# Patient Record
Sex: Female | Born: 1990 | Hispanic: Yes | Marital: Married | Smoking: Never smoker
Health system: Southern US, Community
[De-identification: ages and names within clinical notes are randomized; demographics above are authoritative.]

## PROBLEM LIST (undated history)

## (undated) DIAGNOSIS — N83209 Unspecified ovarian cyst, unspecified side: Secondary | ICD-10-CM

## (undated) HISTORY — PX: NO PAST SURGERIES: SHX2092

---

## 2012-04-17 ENCOUNTER — Ambulatory Visit: Payer: Self-pay | Admitting: Otolaryngology

## 2012-04-17 LAB — HCG, QUANTITATIVE, PREGNANCY: Beta Hcg, Quant.: <1 m[IU]/mL — ABNORMAL LOW

## 2013-11-28 ENCOUNTER — Encounter: Payer: Self-pay | Admitting: Obstetrics and Gynecology

## 2014-01-27 IMAGING — CT CT NECK WITH CONTRAST
2 series · 10 of 14 positions shown, 12 images · IV contrast (agent unspecified)
Comparison: none

REASON FOR EXAM: swelling knot under chin  eval thyroglosssal duct cyst
COMMENTS:

PROCEDURE:     CT  - CT NECK WITH CONTRAST  - April 17, 2012  [DATE]
RESULT:
TECHNIQUE: Neck CT is performed with 70 ml of Tsovue-6V7 iodinated
intravenous contrast with images reconstructed at 3.0 mm slice thickness in
the axial plane.
There is no previous exam for comparison.

[Series 2: soft tissue · axial · 0.57mm/px · z∈[-302,-68]mm · 8 of 102 slices shown, 10 images]
[im 12/102  soft-tissue]
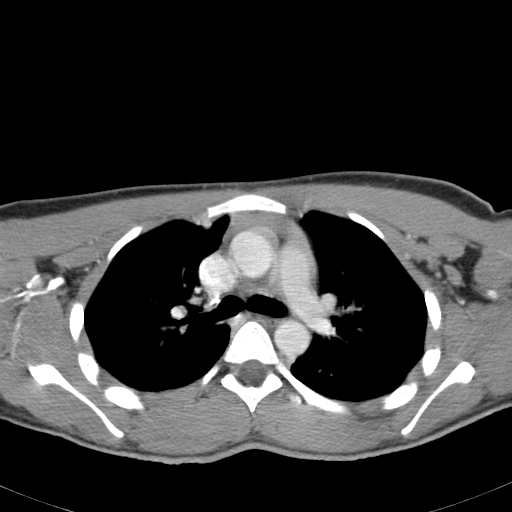
[im 12/102  bone]
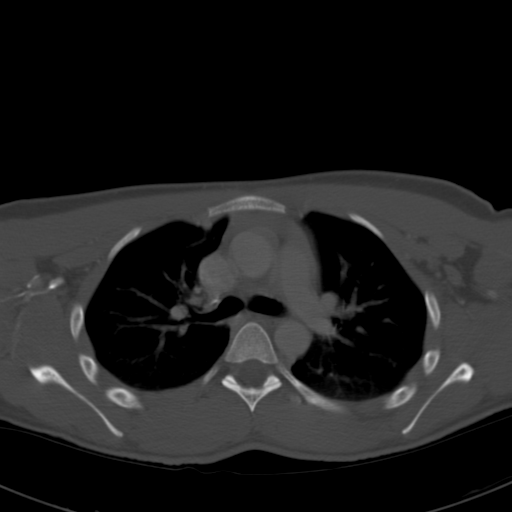
[im 23/102  bone]
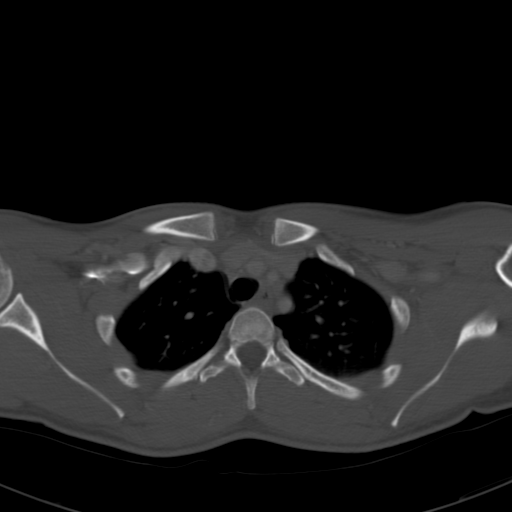
[im 34/102  bone]
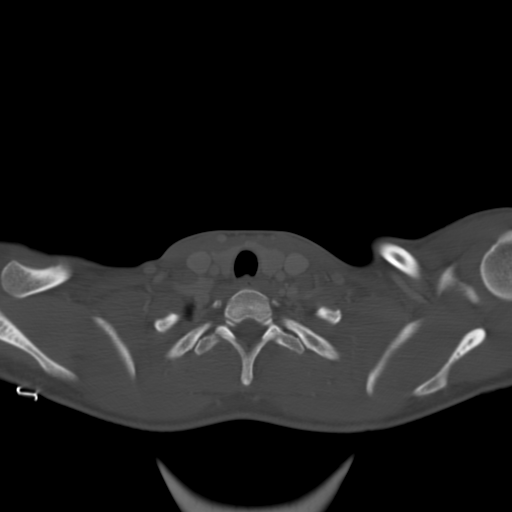
[im 45/102  bone]
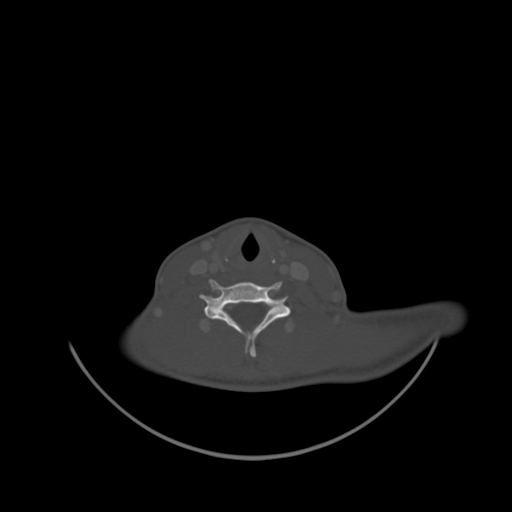
[im 57/102  soft-tissue]
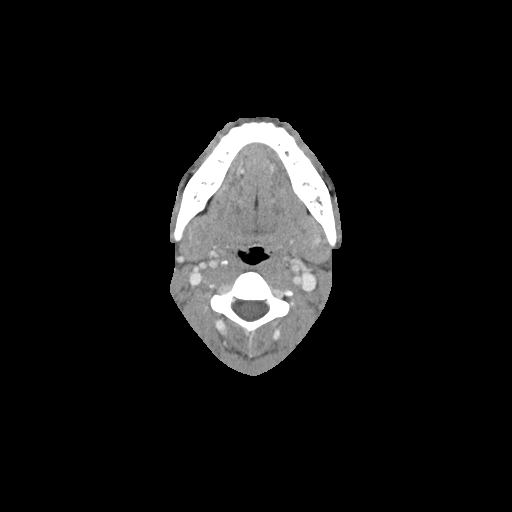
[im 57/102  bone]
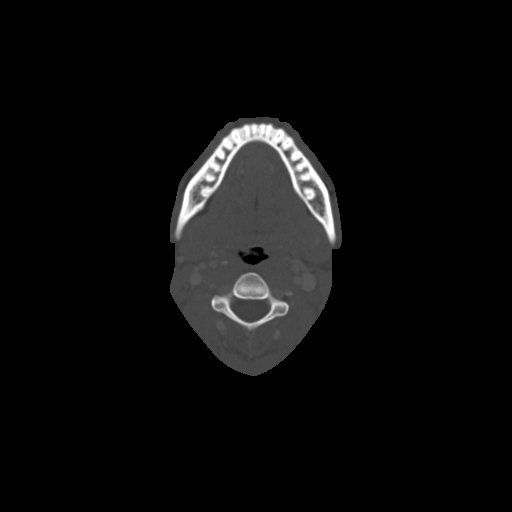
[im 68/102  bone]
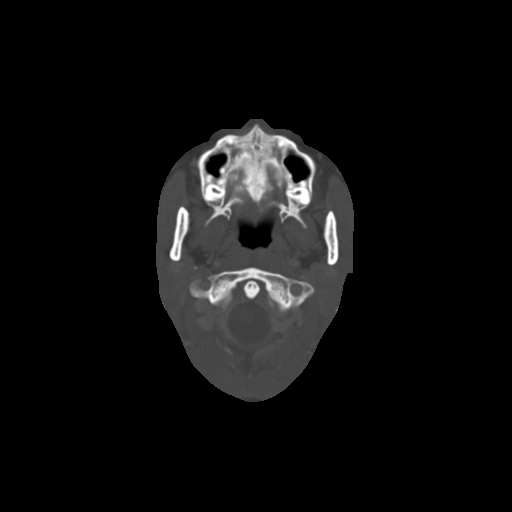
[im 79/102  bone]
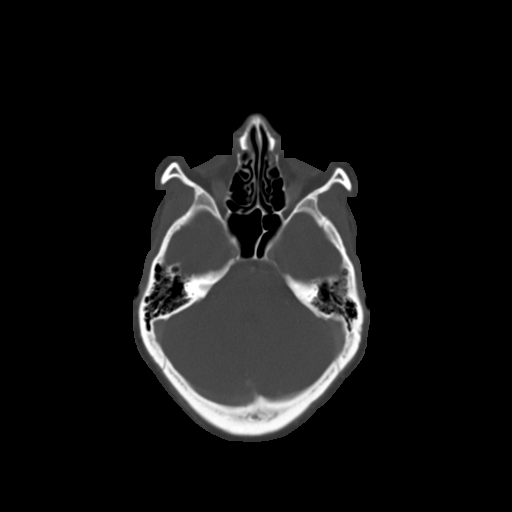
[im 90/102  bone]
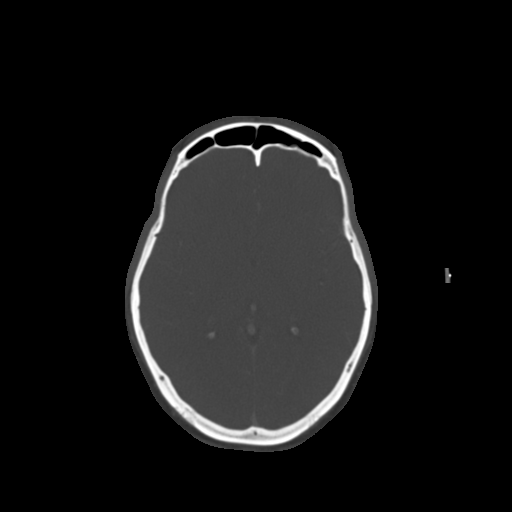

[Series 4: lung windows · axial · 0.66mm/px · z∈[-302,-266]mm · 2 of 36 slices shown]
[im 12/36  bone]
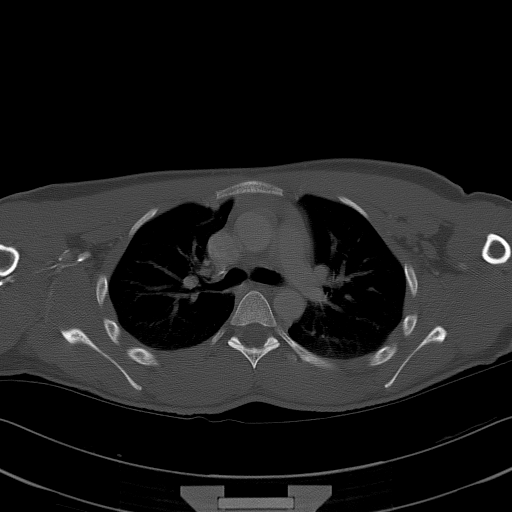
[im 24/36  bone]
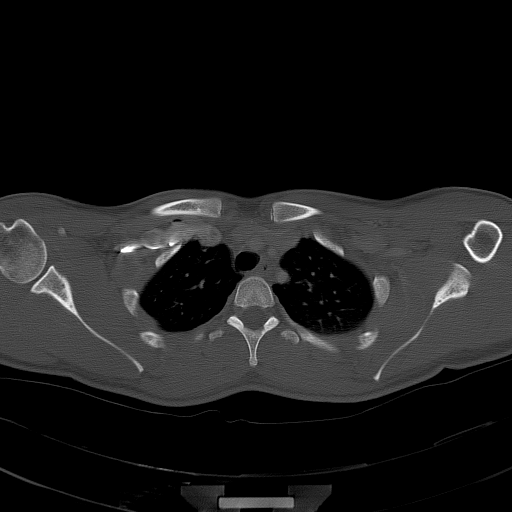

[10 of 14 positions shown; findings below may reference images not displayed]

FINDINGS: There is a submental lymph node present on the axial
reconstructions on image 54 measuring 12.5 x 7.6 mm. The submandibular and
parotid glands appear to be unremarkable. The sinuses show normal aeration.
The mastoid air cells are unremarkable. The orbits appear normal. The base
of the brain and included mid brain region appear to be unremarkable. The
temporal lobes show no focal abnormality. The nasopharynx, oropharynx,
hypopharynx and laryngeal region appear unremarkable. The thyroid lobes show
no discrete mass, enlargement or abnormal enhancement. The supraclavicular,
superior mediastinal and axillary regions are unremarkable. There is a
calcified granuloma posteriorly in the left lung on image 99 in the superior
segment of the left lower lobe consistent with previous granulomatous
disease. The lungs otherwise appear clear.
IMPRESSION: The area of concern appears to correlate with a lymph node.
No thyroglossal duct cyst is appreciated. No mucosal based lesion is evident.

[REDACTED]

## 2014-06-11 ENCOUNTER — Inpatient Hospital Stay: Payer: Self-pay | Admitting: Obstetrics and Gynecology

## 2014-06-11 ENCOUNTER — Observation Stay: Payer: Self-pay | Admitting: Obstetrics and Gynecology

## 2014-08-05 NOTE — H&P (Signed)
L&D Evaluation:  History:  HPI 24 yo G2P1001 at 40.7 presenting in labor. She had been seen previously today and ruled out for labor. Now presenting 5 cm painfully contracting.   Presents with contractions   [name] Medical History other  3.8 cm simple cyst seen during pregnancy.   [name] Surgical History none   Medications Pre Natal Vitamins   Allergies NKDA   Social History none   Family History Non-Contributory   ROS:  ROS All systems were reviewed.  HEENT, CNS, GI, GU, Respiratory, CV, Renal and Musculoskeletal systems were found to be normal.   Exam:  Vital Signs stable   General no apparent distress   Mental Status clear   Chest clear   Heart normal sinus rhythm   Abdomen gravid, non-tender   Estimated Fetal Weight Average for gestational age   Fetal Position Cephalic   Back no CVAT   Mebranes Intact   FHT normal rate with no decels   Ucx regular   Impression:  Impression active labor   Plan:  Comments 1. Expectant management 2. FWB: Cat 1 strip 3. Anticipate vaginal delivery   Electronic Signatures: Areta HaberGoodman, Krisann Mckenna M (MD)  (Signed 17-Mar-16 03:20)  Authored: L&D Evaluation   Last Updated: 17-Mar-16 03:20 by Areta HaberGoodman, Lorimer Tiberio M (MD)

## 2015-09-09 ENCOUNTER — Other Ambulatory Visit: Payer: Self-pay | Admitting: Otolaryngology

## 2015-09-09 DIAGNOSIS — R221 Localized swelling, mass and lump, neck: Secondary | ICD-10-CM

## 2015-09-16 ENCOUNTER — Ambulatory Visit: Payer: BLUE CROSS/BLUE SHIELD

## 2017-07-27 DIAGNOSIS — N949 Unspecified condition associated with female genital organs and menstrual cycle: Secondary | ICD-10-CM | POA: Insufficient documentation

## 2017-08-25 ENCOUNTER — Encounter: Payer: Self-pay | Admitting: *Deleted

## 2017-08-25 ENCOUNTER — Encounter
Admission: RE | Admit: 2017-08-25 | Discharge: 2017-08-25 | Disposition: A | Discharge status: Home / Self Care | Payer: BLUE CROSS/BLUE SHIELD | Source: Ambulatory Visit | Attending: Obstetrics and Gynecology | Admitting: Obstetrics and Gynecology

## 2017-08-25 ENCOUNTER — Other Ambulatory Visit: Payer: Self-pay

## 2017-08-25 DIAGNOSIS — Z0183 Encounter for blood typing: Secondary | ICD-10-CM | POA: Insufficient documentation

## 2017-08-25 DIAGNOSIS — Z01812 Encounter for preprocedural laboratory examination: Secondary | ICD-10-CM | POA: Insufficient documentation

## 2017-08-25 HISTORY — DX: Unspecified ovarian cyst, unspecified side: N83.209

## 2017-08-25 LAB — TYPE AND SCREEN
ABO/RH(D): A POS
ANTIBODY SCREEN: NEGATIVE

## 2017-08-25 LAB — BASIC METABOLIC PANEL
ANION GAP: 6 (ref 5–15)
BUN: 10 mg/dL (ref 6–20)
CALCIUM: 8.7 mg/dL — AB (ref 8.9–10.3)
CO2: 26 mmol/L (ref 22–32)
CREATININE: 0.63 mg/dL (ref 0.44–1.00)
Chloride: 108 mmol/L (ref 101–111)
Glucose, Bld: 86 mg/dL (ref 65–99)
Potassium: 3.9 mmol/L (ref 3.5–5.1)
SODIUM: 140 mmol/L (ref 135–145)

## 2017-08-25 LAB — CBC
HCT: 40 % (ref 35.0–47.0)
HEMOGLOBIN: 13.6 g/dL (ref 12.0–16.0)
MCH: 31.5 pg (ref 26.0–34.0)
MCHC: 33.9 g/dL (ref 32.0–36.0)
MCV: 93 fL (ref 80.0–100.0)
Platelets: 166 10*3/uL (ref 150–440)
RBC: 4.31 MIL/uL (ref 3.80–5.20)
RDW: 13.1 % (ref 11.5–14.5)
WBC: 5.3 10*3/uL (ref 3.6–11.0)

## 2017-08-25 NOTE — H&P (Signed)
Chief Complaint:    Patient ID: Jill Montgomery is a 27 y.o. female presenting with Pre Op Consulting (sign consents)  on 08/25/2017  HPI:  1. Left ovarian cyst, followed in 2017 when it was 3 cm, now 4.6cm complex left ovarian cyst with small septation, appears blood filled, small borders.   2. Dyspareunia: - present for several months - pain in band across abdomen - worse on left  3. 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 Dyschezia - present for several months as well - described as "stomach upset" - no particular food makes better or worse - no weight loss  6. Trying to conceive x 8 months - worried that the ovarian cysts are keeping her from getting pregnant   Pelvic pain now in band, sounds like endometriosis. Make sure to bx all quadrants.  Past Medical History:  has a past medical history of Anemia, unspecified, Neck mass, and Pap smear, as part of routine gynecological examination (04/11/12).  Past Surgical History:  has no past surgical history on file. Family History: family history includes No Known Problems in her father and mother. Social History:  reports that she has never smoked. She has never used smokeless tobacco. She reports that she does not drink alcohol or use drugs. OB/GYN History:  OB History    Gravida  2   Para  1   Term  1   Preterm      AB      Living  1     SAB      TAB      Ectopic      Molar      Multiple      Live Births             Allergies: has No Known Allergies. Medications:  Current Outpatient Medications:  .  Compound Medication, BLT cream, Disp: 1 each, Rfl: 0 .  meloxicam (MOBIC) 7.5 MG tablet, Take 1 tablet (7.5 mg total) by mouth once daily, Disp: 30 tablet, Rfl: 1   Review of Systems: No SOB, no palpitations  or chest pain, no new lower extremity edema, no nausea or vomiting or bowel or bladder complaints. See HPI for gyn specific ROS.   Exam:     BP 120/80   Pulse 72   Ht 160 cm (5\' 3" )   Wt 59.4 kg (131 lb)   BMI 23.21 kg/m   General: Patient is well-groomed, well-nourished, appears stated age in no acute distress  HEENT: head is atraumatic and normocephalic, trachea is midline, neck is supple with no palpable nodules  CV: Regular rhythm and normal heart rate, no murmur  Pulm: Clear to auscultation throughout lung fields with no wheezing, crackles, or rhonchi. No increased work of breathing  Abdomen: soft , no mass, non-tender, no rebound tenderness, no hepatomegaly  Pelvic:  deferred   Impression:   The encounter diagnosis was Preop examination.    Plan:    The patient has been diagnosed with a left adnexal mass measuring 4 cm, chronic pelvic pain and is trying to conceive. I would like to try ovarian suppression but she declines for now.  I have reviewed treatment options with the patient, including expectant management, hormonal treatment with combination or progesterone-only  methods or surgical treatment with a  laproscopic cystectomy with chromotubation and possible peritoneal biopsies.  Risks and benefits of surgery have been reviewed with the patient, including but not limited to infection, bleeding, injury to the uterus and surrounding organs, such as bowel, bladder, blood vessels, nerves and ureters, risks of anesthesia, blood clots and death.  The details of the procedure were discussed in detail.  The patient has been made aware that if the procedure cannot be accomplished safely with a minimally invasive technique, I will convert to an open procedure.   Consents have been signed and all questions have been answered to her satisfaction  Return for Postop check.  Jill KicksBETHANY EVANGELINE Kourtnie Sachs, MD

## 2017-08-25 NOTE — Patient Instructions (Addendum)
Your procedure is scheduled on: Friday, September 01, 2017 Report to Day Surgery on the 2nd floor of the [facility] To find out your arrival time, please call [phone] between 1PM - 3PM on: Thursday, August 31, 2017  REMEMBER: Instructions that are not followed completely may result in serious medical risk, up to and including death; or upon the discretion of your surgeon and anesthesiologist your surgery may need to be rescheduled.  Do not eat food after midnight the night before your procedure.  No gum chewing, lozengers or hard candies.  You may however, drink CLEAR liquids up to 2 hours before you are scheduled to arrive for your surgery. Do not drink anything within 2 hours of the start of your surgery.  Clear liquids include: - water  - apple juice without pulp - clear gatorade - black coffee or tea (Do NOT add anything to the coffee or tea) Do NOT drink anything that is not on this list.  No Alcohol for 24 hours before or after surgery.  No Smoking including e-cigarettes for 24 hours prior to surgery.  No chewable tobacco products for at least 6 hours prior to surgery.  No nicotine patches on the day of surgery.  On the morning of surgery brush your teeth with toothpaste and water, you may rinse your mouth with mouthwash if you wish. Do not swallow any toothpaste or mouthwash.  Notify your doctor if there is any change in your medical condition (cold, fever, infection).  Do not wear jewelry, make-up, hairpins, clips or nail polish.  Do not wear lotions, powders, or perfumes. You may wear deodorant.  Do not shave 48 hours prior to surgery.   Contacts and dentures may not be worn into surgery.  Do not bring valuables to the hospital, including drivers license, insurance or credit cards.   is not responsible for any belongings or valuables.   TAKE THESE MEDICATIONS THE MORNING OF SURGERY:  none  Use CHG Soap as directed on instruction sheet.  NOW!  Stop  Anti-inflammatories (NSAIDS) such as MELOXICAM, Advil, Aleve, Ibuprofen, Motrin, Naproxen, Naprosyn and Aspirin based products such as Excedrin, Goodys Powder, BC Powder. (May take Tylenol or Acetaminophen if needed.)  NOW!  Stop ANY OVER THE COUNTER supplements until after surgery.  Wear comfortable clothing (specific to your surgery type) to the hospital.  Plan for stool softeners for home use.  If you are being discharged the day of surgery, you will not be allowed to drive home. You will need a responsible adult to drive you home and stay with you that night.   If you are taking public transportation, you will need to have a responsible adult with you. Please confirm with your physician that it is acceptable to use public transportation.   Please call [phone] if you have any questions about these instructions.     Su procedimiento est programado para: Friday, September 01, 2017  Presntese a: Day Surgery  Para saber su hora de llegada por favor llame al [phone] entre la 1PM - 3PM el da:Thursday, August 31, 2017   Recuerde: [employer] no se siguen Programmer, multimedia en un riesgo de salud grave, incluyendo hasta la [place] o a discrecin de su cirujano y Scientific laboratory technician, su ciruga se puede posponer.    No coma nada despus de la medianoche de la noche anterior a su  procedimiento. No coma chicles ni caramelos duros. Puede tomar lquidos claros hasta 2 horas antes de su hora  programada de llegada al hospital para su procedimiento. No tome lquidos claros durante el transcurso de las 2 horas de su llegada programada al hospital para su procedimiento, ya que esto puede llevar a que su procedimiento se retrase o tenga que volver a Magazine features editorprogramarse.  Los lquidos claros incluyen:         - Agua o jugo de Boulevardmanzana sin pulpa         - Bebidas claras con carbohidratos como ClearFast o Gatorade         - Caf negro o t claro (sin leche, sin cremas, no agregue nada al  caf ni al  t)  No tome nada que no est en esta lista.  Los pacientes con diabetes tipo 1 y tipo 2 solo deben Printmakertomar agua.  Llame a la clnica de PreCare o a la unidad de Same Day Surgery si tiene alguna pregunta sobre estas instrucciones.  No fume ni use cigarrillos electrnicos durante las 24 horas previas a su [place].  No use ningn producto de tabaco masticable durante al menos 6 horas antes de la [place].    No tome alcohol durante las 24 horas antes ni despus de la [place].    Informe a su mdico si hay algn cambio en su condicin mdica (resfriado, fiebre, infecciones).   No use joyas, maquillajes, pinzas/ganchos para el cabello ni esmalte de uas.  No use lociones, polvos o perfumes.  Puede usar desodorante.    No se afeite 48 horas antes de la [place].  Los hombres pueden Commercial Metals Companyafeitarse la cara y el cuello.  No lleve objetos de valor al hospital.  [facility] no se hace responsable de ningn tipo de pertenencias u objetos de Licensed conveyancervalor.    Los lentes de Oreanacontacto, las dentaduras postizas o puentes no se pueden usar en la [place].  Deje su maleta en el auto.  Despus de la ciruga podr traerla a su habitacin.  Para los pacientes que sean ingresados al hospital, el tiempo en el cual se ledar de alta es determinado por su equipo de Rincontratamiento.   A los pacientes que se les da de alta el mismo da de la ciruga no se les permitir conducir a Higher education careers advisercasa.    Utilice el jabn de CHG segn lo indicado Deje de tomar antiinflamatorios el da:  MELOXICAM Deje de tomar suplementos hasta despus de la Leisure centre managerciruga

## 2017-09-01 ENCOUNTER — Encounter: Payer: Self-pay | Admitting: Anesthesiology

## 2017-09-01 ENCOUNTER — Ambulatory Visit: Payer: BLUE CROSS/BLUE SHIELD | Admitting: Anesthesiology

## 2017-09-01 ENCOUNTER — Encounter
Admission: RE | Disposition: A | Discharge status: Home / Self Care | Payer: Self-pay | Source: Ambulatory Visit | Attending: Obstetrics and Gynecology

## 2017-09-01 ENCOUNTER — Ambulatory Visit
Admission: RE | Admit: 2017-09-01 | Discharge: 2017-09-01 | Disposition: A | Discharge status: Home / Self Care | Payer: BLUE CROSS/BLUE SHIELD | Source: Ambulatory Visit | Attending: Obstetrics and Gynecology | Admitting: Obstetrics and Gynecology

## 2017-09-01 DIAGNOSIS — Z791 Long term (current) use of non-steroidal anti-inflammatories (NSAID): Secondary | ICD-10-CM | POA: Diagnosis not present

## 2017-09-01 DIAGNOSIS — K5900 Constipation, unspecified: Secondary | ICD-10-CM | POA: Diagnosis not present

## 2017-09-01 DIAGNOSIS — N94819 Vulvodynia, unspecified: Secondary | ICD-10-CM | POA: Insufficient documentation

## 2017-09-01 DIAGNOSIS — N83202 Unspecified ovarian cyst, left side: Secondary | ICD-10-CM | POA: Insufficient documentation

## 2017-09-01 DIAGNOSIS — N8302 Follicular cyst of left ovary: Secondary | ICD-10-CM | POA: Diagnosis not present

## 2017-09-01 DIAGNOSIS — R1032 Left lower quadrant pain: Secondary | ICD-10-CM | POA: Diagnosis not present

## 2017-09-01 DIAGNOSIS — R102 Pelvic and perineal pain: Secondary | ICD-10-CM | POA: Insufficient documentation

## 2017-09-01 DIAGNOSIS — N941 Unspecified dyspareunia: Secondary | ICD-10-CM | POA: Diagnosis not present

## 2017-09-01 HISTORY — PX: LAPAROSCOPIC OVARIAN CYSTECTOMY: SHX6248

## 2017-09-01 HISTORY — PX: CHROMOPERTUBATION: SHX6288

## 2017-09-01 LAB — POCT PREGNANCY, URINE: PREG TEST UR: NEGATIVE

## 2017-09-01 LAB — ABO/RH: ABO/RH(D): A POS

## 2017-09-01 SURGERY — EXCISION, CYST, OVARY, LAPAROSCOPIC
Anesthesia: General

## 2017-09-01 MED ORDER — LACTATED RINGERS IV SOLN
INTRAVENOUS | Status: DC
  Administered 2017-09-01 (×2): via INTRAVENOUS

## 2017-09-01 MED ORDER — ROCURONIUM BROMIDE 50 MG/5ML IV SOLN
INTRAVENOUS | Status: AC
  Filled 2017-09-01: qty 1

## 2017-09-01 MED ORDER — CEFAZOLIN SODIUM-DEXTROSE 1-4 GM/50ML-% IV SOLN
INTRAVENOUS | Status: DC | PRN
  Administered 2017-09-01: 1 g via INTRAVENOUS

## 2017-09-01 MED ORDER — FENTANYL CITRATE (PF) 100 MCG/2ML IJ SOLN
INTRAMUSCULAR | Status: AC
  Filled 2017-09-01: qty 2

## 2017-09-01 MED ORDER — DEXAMETHASONE SODIUM PHOSPHATE 10 MG/ML IJ SOLN
INTRAMUSCULAR | Status: DC | PRN
  Administered 2017-09-01: 10 mg via INTRAVENOUS

## 2017-09-01 MED ORDER — GABAPENTIN 300 MG PO CAPS
900.0000 mg | ORAL_CAPSULE | ORAL | Status: AC
  Administered 2017-09-01: 900 mg via ORAL

## 2017-09-01 MED ORDER — DOXYCYCLINE HYCLATE 100 MG PO TABS
100.0000 mg | ORAL_TABLET | ORAL | Status: AC
  Administered 2017-09-01: 100 mg via ORAL
  Filled 2017-09-01: qty 1

## 2017-09-01 MED ORDER — CELECOXIB 200 MG PO CAPS
ORAL_CAPSULE | ORAL | Status: AC
  Administered 2017-09-01: 400 mg via ORAL
  Filled 2017-09-01: qty 2

## 2017-09-01 MED ORDER — ACETAMINOPHEN 500 MG PO TABS
1000.0000 mg | ORAL_TABLET | ORAL | Status: AC
  Administered 2017-09-01: 1000 mg via ORAL

## 2017-09-01 MED ORDER — METHYLENE BLUE 0.5 % INJ SOLN
INTRAVENOUS | Status: AC
  Filled 2017-09-01: qty 10

## 2017-09-01 MED ORDER — OXYCODONE HCL 5 MG PO CAPS: 5.0000 mg | ORAL_CAPSULE | Freq: Four times a day (QID) | ORAL | 0 refills | Status: DC | PRN

## 2017-09-01 MED ORDER — DOCUSATE SODIUM 100 MG PO CAPS: 100.0000 mg | ORAL_CAPSULE | Freq: Two times a day (BID) | ORAL | 0 refills | Status: DC

## 2017-09-01 MED ORDER — CEFAZOLIN SODIUM 1 G IJ SOLR
INTRAMUSCULAR | Status: AC
  Filled 2017-09-01: qty 10

## 2017-09-01 MED ORDER — BUPIVACAINE HCL 0.5 % IJ SOLN
INTRAMUSCULAR | Status: DC | PRN
  Administered 2017-09-01: 20 mL

## 2017-09-01 MED ORDER — DEXAMETHASONE SODIUM PHOSPHATE 10 MG/ML IJ SOLN
INTRAMUSCULAR | Status: AC
  Filled 2017-09-01: qty 1

## 2017-09-01 MED ORDER — FENTANYL CITRATE (PF) 100 MCG/2ML IJ SOLN
INTRAMUSCULAR | Status: AC
  Administered 2017-09-01: 25 ug via INTRAVENOUS
  Filled 2017-09-01: qty 2

## 2017-09-01 MED ORDER — MELOXICAM 7.5 MG PO TABS: 7.5000 mg | ORAL_TABLET | Freq: Every day | ORAL | 1 refills | Status: DC | PRN

## 2017-09-01 MED ORDER — IBUPROFEN 800 MG PO TABS: 800.0000 mg | ORAL_TABLET | Freq: Three times a day (TID) | ORAL | 1 refills | Status: DC | PRN

## 2017-09-01 MED ORDER — ONDANSETRON HCL 4 MG/2ML IJ SOLN
INTRAMUSCULAR | Status: AC
  Filled 2017-09-01: qty 2

## 2017-09-01 MED ORDER — OXYCODONE HCL 5 MG PO TABS
ORAL_TABLET | ORAL | Status: AC
  Filled 2017-09-01: qty 1

## 2017-09-01 MED ORDER — FAMOTIDINE 20 MG PO TABS
20.0000 mg | ORAL_TABLET | Freq: Once | ORAL | Status: AC
  Administered 2017-09-01: 20 mg via ORAL

## 2017-09-01 MED ORDER — OXYCODONE HCL 5 MG PO TABS
5.0000 mg | ORAL_TABLET | Freq: Four times a day (QID) | ORAL | Status: DC | PRN
  Administered 2017-09-01: 5 mg via ORAL

## 2017-09-01 MED ORDER — MIDAZOLAM HCL 2 MG/2ML IJ SOLN
INTRAMUSCULAR | Status: AC
  Filled 2017-09-01: qty 2

## 2017-09-01 MED ORDER — PROPOFOL 10 MG/ML IV BOLUS
INTRAVENOUS | Status: DC | PRN
  Administered 2017-09-01: 150 mg via INTRAVENOUS

## 2017-09-01 MED ORDER — ACETAMINOPHEN 10 MG/ML IV SOLN
INTRAVENOUS | Status: DC | PRN
  Administered 2017-09-01: 1000 mg via INTRAVENOUS

## 2017-09-01 MED ORDER — ACETAMINOPHEN 500 MG PO TABS
ORAL_TABLET | ORAL | Status: AC
  Administered 2017-09-01: 1000 mg via ORAL
  Filled 2017-09-01: qty 2

## 2017-09-01 MED ORDER — PROPOFOL 10 MG/ML IV BOLUS
INTRAVENOUS | Status: AC
  Filled 2017-09-01: qty 20

## 2017-09-01 MED ORDER — GABAPENTIN 300 MG PO CAPS
ORAL_CAPSULE | ORAL | Status: AC
  Administered 2017-09-01: 900 mg via ORAL
  Filled 2017-09-01: qty 3

## 2017-09-01 MED ORDER — ONDANSETRON HCL 4 MG/2ML IJ SOLN
INTRAMUSCULAR | Status: DC | PRN
  Administered 2017-09-01: 4 mg via INTRAVENOUS

## 2017-09-01 MED ORDER — FENTANYL CITRATE (PF) 100 MCG/2ML IJ SOLN
INTRAMUSCULAR | Status: DC | PRN
  Administered 2017-09-01: 100 ug via INTRAVENOUS
  Administered 2017-09-01 (×2): 50 ug via INTRAVENOUS

## 2017-09-01 MED ORDER — GABAPENTIN 800 MG PO TABS: 800.0000 mg | ORAL_TABLET | Freq: Every day | ORAL | 0 refills | Status: DC

## 2017-09-01 MED ORDER — FAMOTIDINE 20 MG PO TABS
ORAL_TABLET | ORAL | Status: AC
  Administered 2017-09-01: 20 mg via ORAL
  Filled 2017-09-01: qty 1

## 2017-09-01 MED ORDER — ONDANSETRON HCL 4 MG/2ML IJ SOLN: 4.0000 mg | Freq: Once | INTRAMUSCULAR | Status: DC | PRN

## 2017-09-01 MED ORDER — MIDAZOLAM HCL 2 MG/2ML IJ SOLN
INTRAMUSCULAR | Status: DC | PRN
  Administered 2017-09-01: 2 mg via INTRAVENOUS

## 2017-09-01 MED ORDER — SUGAMMADEX SODIUM 200 MG/2ML IV SOLN
INTRAVENOUS | Status: DC | PRN
  Administered 2017-09-01: 200 mg via INTRAVENOUS

## 2017-09-01 MED ORDER — ACETAMINOPHEN 500 MG PO TABS: 1000.0000 mg | ORAL_TABLET | Freq: Four times a day (QID) | ORAL | 0 refills | Status: AC

## 2017-09-01 MED ORDER — ROCURONIUM BROMIDE 100 MG/10ML IV SOLN
INTRAVENOUS | Status: DC | PRN
  Administered 2017-09-01: 10 mg via INTRAVENOUS
  Administered 2017-09-01: 40 mg via INTRAVENOUS

## 2017-09-01 MED ORDER — BUPIVACAINE HCL (PF) 0.5 % IJ SOLN
INTRAMUSCULAR | Status: AC
  Filled 2017-09-01: qty 30

## 2017-09-01 MED ORDER — ACETAMINOPHEN NICU IV SYRINGE 10 MG/ML
INTRAVENOUS | Status: AC
  Filled 2017-09-01: qty 1

## 2017-09-01 MED ORDER — CELECOXIB 200 MG PO CAPS
400.0000 mg | ORAL_CAPSULE | ORAL | Status: AC
  Administered 2017-09-01: 400 mg via ORAL

## 2017-09-01 MED ORDER — FENTANYL CITRATE (PF) 100 MCG/2ML IJ SOLN
25.0000 ug | INTRAMUSCULAR | Status: DC | PRN
  Administered 2017-09-01 (×4): 25 ug via INTRAVENOUS

## 2017-09-01 SURGICAL SUPPLY — 37 items
APPLICATOR ARISTA FLEXITIP XL (MISCELLANEOUS) ×4 IMPLANT
BAG URINE DRAINAGE (UROLOGICAL SUPPLIES) IMPLANT
BLADE SURG SZ11 CARB STEEL (BLADE) ×4 IMPLANT
CATH FOLEY 2WAY  5CC 16FR (CATHETERS) ×2
CATH URTH 16FR FL 2W BLN LF (CATHETERS) ×2 IMPLANT
CHLORAPREP W/TINT 26ML (MISCELLANEOUS) ×4 IMPLANT
CLOSURE WOUND 1/4X4 (GAUZE/BANDAGES/DRESSINGS)
DERMABOND ADVANCED (GAUZE/BANDAGES/DRESSINGS) ×2
DERMABOND ADVANCED .7 DNX12 (GAUZE/BANDAGES/DRESSINGS) ×2 IMPLANT
GLOVE BIO SURGEON STRL SZ7 (GLOVE) ×8 IMPLANT
GLOVE INDICATOR 7.5 STRL GRN (GLOVE) ×4 IMPLANT
GOWN STRL REUS W/ TWL LRG LVL3 (GOWN DISPOSABLE) ×4 IMPLANT
GOWN STRL REUS W/TWL LRG LVL3 (GOWN DISPOSABLE) ×4
HEMOSTAT ARISTA ABSORB 1G (MISCELLANEOUS) ×4 IMPLANT
IRRIGATION STRYKERFLOW (MISCELLANEOUS) ×2 IMPLANT
IRRIGATOR STRYKERFLOW (MISCELLANEOUS) ×4
IV NS 1000ML (IV SOLUTION) ×2
IV NS 1000ML BAXH (IV SOLUTION) ×2 IMPLANT
KIT PINK PAD W/HEAD ARE REST (MISCELLANEOUS) ×4
KIT PINK PAD W/HEAD ARM REST (MISCELLANEOUS) ×2 IMPLANT
KIT TURNOVER CYSTO (KITS) ×4 IMPLANT
LABEL OR SOLS (LABEL) ×4 IMPLANT
NS IRRIG 500ML POUR BTL (IV SOLUTION) ×4 IMPLANT
PACK GYN LAPAROSCOPIC (MISCELLANEOUS) ×4 IMPLANT
PAD OB MATERNITY 4.3X12.25 (PERSONAL CARE ITEMS) ×4 IMPLANT
PAD PREP 24X41 OB/GYN DISP (PERSONAL CARE ITEMS) ×4 IMPLANT
POUCH SPECIMEN RETRIEVAL 10MM (ENDOMECHANICALS) IMPLANT
SCISSORS METZENBAUM CVD 33 (INSTRUMENTS) ×4 IMPLANT
SET CYSTO W/LG BORE CLAMP LF (SET/KITS/TRAYS/PACK) IMPLANT
SLEEVE ENDOPATH XCEL 5M (ENDOMECHANICALS) ×4 IMPLANT
STRIP CLOSURE SKIN 1/4X4 (GAUZE/BANDAGES/DRESSINGS) IMPLANT
SUT MNCRL AB 4-0 PS2 18 (SUTURE) ×4 IMPLANT
SUT VIC AB 2-0 UR6 27 (SUTURE) ×4 IMPLANT
SUT VIC AB 4-0 SH 27 (SUTURE) ×2
SUT VIC AB 4-0 SH 27XANBCTRL (SUTURE) ×2 IMPLANT
TROCAR XCEL NON-BLD 5MMX100MML (ENDOMECHANICALS) ×4 IMPLANT
TUBING INSUFFLATION (TUBING) ×4 IMPLANT

## 2017-09-01 NOTE — Anesthesia Preprocedure Evaluation (Addendum)
Anesthesia Evaluation  Patient identified by MRN, date of birth, ID band Patient awake    Reviewed: Allergy & Precautions, NPO status , [name] Chart, lab work & pertinent test results, reviewed documented beta blocker date and time   Airway Mallampati: II  TM Distance: >3 FB     Dental  (+) Chipped, Partial Upper   Pulmonary           Cardiovascular      Neuro/Psych    GI/Hepatic   Endo/Other    Renal/GU      Musculoskeletal   Abdominal   Peds  Hematology   Anesthesia Other Findings   Reproductive/Obstetrics                            Anesthesia Physical Anesthesia Plan  ASA: II  Anesthesia Plan: General   Post-op Pain Management:    Induction: Intravenous  PONV Risk Score and Plan:   Airway Management Planned: Oral ETT  Additional Equipment:   Intra-op Plan:   Post-operative Plan:   Informed Consent: I have reviewed the patients History and Physical, chart, labs and discussed the procedure including the risks, benefits and alternatives for the proposed anesthesia with the patient or authorized representative who has indicated his/her understanding and acceptance.     Plan Discussed with: CRNA  Anesthesia Plan Comments:         Anesthesia Quick Evaluation

## 2017-09-01 NOTE — Transfer of Care (Signed)
Immediate Anesthesia Transfer of Care Note  Patient: Jill Montgomery  Procedure(s) Performed: LAPAROSCOPIC OVARIAN CYSTECTOMY (Left ) CHROMOPERTUBATION (N/A )  Patient Location: PACU  Anesthesia Type:General  Level of Consciousness: sedated  Airway & Oxygen Therapy: Patient connected to face mask oxygen  Post-op Assessment: Post -op Vital signs reviewed and stable  Post vital signs: stable  Last Vitals:  Vitals Value Taken Time  BP    Temp 36.6 C 09/01/2017  7:32 PM  Pulse 63 09/01/2017  7:32 PM  Resp 9 09/01/2017  7:32 PM  SpO2 100 % 09/01/2017  7:32 PM    Last Pain:  Vitals:   09/01/17 1932  TempSrc: Temporal  PainSc:          Complications: No apparent anesthesia complications

## 2017-09-01 NOTE — Anesthesia Procedure Notes (Signed)
Procedure Name: Intubation Date/Time: 09/01/2017 5:39 PM Performed by: Jonna Clark, CRNA Pre-anesthesia Checklist: Patient identified, Patient being monitored, Timeout performed, Emergency Drugs available and Suction available Patient Re-evaluated:Patient Re-evaluated prior to induction Oxygen Delivery Method: Circle system utilized Preoxygenation: Pre-oxygenation with 100% oxygen Induction Type: IV induction Ventilation: Mask ventilation without difficulty Laryngoscope Size: Mac and 3 Grade View: Grade I Tube type: Oral Tube size: 7.0 mm Number of attempts: 1 Airway Equipment and Method: Stylet Placement Confirmation: ETT inserted through vocal cords under direct vision,  positive ETCO2 and breath sounds checked- equal and bilateral Secured at: 21 cm Tube secured with: Tape Dental Injury: Teeth and Oropharynx as per pre-operative assessment

## 2017-09-01 NOTE — Op Note (Signed)
Jill SavageAda Montgomery PROCEDURE DATE: 09/01/2017  PREOPERATIVE DIAGNOSIS: Chronic pelvic pain, infertility, left ovarian cyst POSTOPERATIVE DIAGNOSIS: Same PROCEDURE: Operative laparoscopy, peritoneal biopsies, left ovarian cystectomy, chromotubation  SURGEON:  Dr. Christeen DouglasBethany Carey Johndrow ASSISTANT: CST ANESTHESIOLOGIST: Lenard SimmerKarenz, [name], MD Anesthesiologist: Lenard SimmerKarenz, [name], MD CRNA: Irving BurtonBachich, [name], CRNA; [name], [name], CRNA  INDICATIONS: 27 y.o.with history of chronic pelvic pain, left ovarian cyst and infertility desiring surgical evaluation.   Please see preoperative notes for further details.  Risks of surgery were discussed with the patient including but not limited to: bleeding which may require transfusion or reoperation; infection which may require antibiotics; injury to bowel, bladder, ureters or other surrounding organs; need for additional procedures including laparotomy; thromboembolic phenomenon, incisional problems and other postoperative/anesthesia complications. Written informed consent was obtained.    FINDINGS:  Boggy uterus, enlarged ovaries and normal fallopian tubes bilaterally.  Peritoneal biopsies were taken and sent to pathology. No other abdominal/pelvic abnormality.  Normal upper abdomen.  Left tube with good spill and right tube blocked without spill noted.  ANESTHESIA:    General INTRAVENOUS FLUIDS: 1100 ml ESTIMATED BLOOD LOSS: 20 ml URINE OUTPUT: 50 ml SPECIMENS: Peritoneal biopsies, left ovarian cyst COMPLICATIONS: None immediate  PROCEDURE IN DETAIL:  The patient had sequential compression devices applied to her lower extremities while in the preoperative area.  She was then taken to the operating room where general anesthesia was administered and was found to be adequate.  She was placed in the dorsal lithotomy position, and was prepped and draped in a sterile manner.  1g Ancef given iv because it was confirmed after anesthesia administered that her po Doxycycline had been  given 7 hrs early I preop. A red rubber catheter was inserted into her bladder and a uterine manipulator was then advanced into the uterus with an acorn cervical cup and a 60 cc syringe attached.  After an adequate timeout was performed, attention was turned to the abdomen where an umbilical incision was made with the scalpel.  The Optiview 5-mm trocar and sleeve were then advanced without difficulty with the laparoscope under direct visualization into the abdomen.  The abdomen was then insufflated with carbon dioxide gas and adequate pneumoperitoneum was obtained.   A detailed survey of the patient's pelvis and abdomen revealed the findings as mentioned above.   Biopsy forceps were used to take peritoneal biopsies.    Ovarian cystectomy completed with dissection forceps and electrocautery. Arista used on the cyst wall bed for hemostasis.   120cc of blue NSS was infused into the uterus through the acorn. Spill noted on the left but not the right.  The operative site was surveyed, and it was found to be hemostatic.  No intraoperative injury to surrounding organs was noted.  Pictures were taken of the quadrants and pelvis. The abdomen was desufflated and all instruments were then removed from the patient's abdomen. The uterine manipulator was removed without complications.  All incisions were closed with 4-0 Vicryl and Dermabond.   The patient tolerated the procedures well.  All instruments, needles, and sponge counts were correct x 2. The patient was taken to the recovery room in stable condition.

## 2017-09-01 NOTE — Anesthesia Post-op Follow-up Note (Signed)
Anesthesia QCDR form completed.        

## 2017-09-01 NOTE — Discharge Instructions (Signed)
Laparoscopic Ovarian Surgery Discharge Instructions  For the next three days, take ibuprofen and acetaminophen on a schedule, every 8 hours. You can take them together or you can intersperse them, and take one every four hours. I also gave you gabapentin for nighttime, to help you sleep and also to control pain. Take gabapentin medicines at night for at least the next 3 nights. You also have a narcotic, oxycodone, to take as needed if the above medicines don't help.  Postop constipation is a major cause of pain. Stay well hydrated, walk as you tolerate, and take over the counter senna as well as stool softeners if you need them.   RISKS AND COMPLICATIONS   Infection.  Bleeding.  Injury to surrounding organs.  Anesthetic side effects.   PROCEDURE   You may be given a medicine to help you relax (sedative) before the procedure. You will be given a medicine to make you sleep (general anesthetic) during the procedure.  A tube will be put down your throat to help your breath while under general anesthesia.  Several small cuts (incisions) are made in the lower abdominal area and one incision is made near the belly button.  Your abdominal area will be inflated with a safe gas (carbon dioxide). This helps give the surgeon room to operate, visualize, and helps the surgeon avoid other organs.  A thin, lighted tube (laparoscope) with a camera attached is inserted into your abdomen through the incision near the belly button. Other small instruments may also be inserted through other abdominal incisions.  The ovary is located and are removed.  After the ovary is removed, the gas is released from the abdomen.  The incisions will be closed with stitches (sutures), and Dermabond. A bandage may be placed over the incisions.  AFTER THE PROCEDURE   You will also have some mild abdominal discomfort for 3-7 days. You will be given pain medicine to ease any discomfort.  As long as there are no  problems, you may be allowed to go home. Someone will need to drive you home and be with you for at least 24 hours once home.  You may have some mild discomfort in the throat. This is from the tube placed in your throat while you were sleeping.  You may experience discomfort in the shoulder area from some trapped air between the liver and diaphragm. This sensation is normal and will slowly go away on its own.  HOME CARE INSTRUCTIONS   Take all medicines as directed.  Only take over-the-counter or prescription medicines for pain, discomfort, or fever as directed by your caregiver.  Resume daily activities as directed.  Showers are preferred over baths for 2 weeks.  You may resume sexual activities in 1 week or as you feel you would like to.  Do not drive while taking narcotics.  SEEK MEDICAL CARE IF: .  There is increasing abdominal pain.  You feel lightheaded or faint.  You have the chills.  You have an oral temperature above 102 F (38.9 C).  There is pus-like (purulent) drainage from any of the wounds.  You are unable to pass gas or have a bowel movement.  You feel sick to your stomach (nauseous) or throw up (vomit) and can't control it with your medicines.  MAKE SURE YOU:   Understand these instructions.  Will watch your condition.  Will get help right away if you are not doing well or get worse.  ExitCare Patient Information 2013 [employer].     AMBULATORY SURGERY  DISCHARGE INSTRUCTIONS   1) The drugs that you were given will stay in your system until tomorrow so for the next 24 hours you should not:  A) Drive an automobile B) Make any legal decisions C) Drink any alcoholic beverage   2) You may resume regular meals tomorrow.  Today it is better to start with liquids and gradually work up to solid foods.  You may eat anything you prefer, but it is better to start with liquids, then soup and crackers, and gradually work up to solid  foods.   3) Please notify your doctor immediately if you have any unusual bleeding, trouble breathing, redness and pain at the surgery site, drainage, fever, or pain not relieved by medication.    4) Additional Instructions:        Please contact your physician with any problems or Same Day Surgery at [phone], Monday through Friday 6 am to 4 pm, or [place] [facility] [place] Main number at [phone].   

## 2017-09-04 ENCOUNTER — Encounter: Payer: Self-pay | Admitting: Obstetrics and Gynecology

## 2017-09-04 NOTE — Anesthesia Postprocedure Evaluation (Signed)
Anesthesia Post Note  Patient: Jill Montgomery  Procedure(s) Performed: LAPAROSCOPIC OVARIAN CYSTECTOMY (Left ) CHROMOPERTUBATION (N/A )  Patient location during evaluation: PACU Anesthesia Type: General Level of consciousness: awake and alert Pain management: pain level controlled Vital Signs Assessment: post-procedure vital signs reviewed and stable Respiratory status: spontaneous breathing, nonlabored ventilation, respiratory function stable and patient connected to nasal cannula oxygen Cardiovascular status: blood pressure returned to baseline and stable Postop Assessment: no apparent nausea or vomiting Anesthetic complications: no     Last Vitals:  Vitals:   09/01/17 2102 09/01/17 2155  BP: 137/79 127/76  Pulse: 97 77  Resp: 14 18  Temp:    SpO2: 100% 100%    Last Pain:  Vitals:   09/01/17 2155  TempSrc:   PainSc: 3                  Lenard SimmerAndrew Caro Brundidge

## 2017-09-07 LAB — SURGICAL PATHOLOGY

## 2018-02-06 DIAGNOSIS — Z3490 Encounter for supervision of normal pregnancy, unspecified, unspecified trimester: Secondary | ICD-10-CM

## 2018-02-06 HISTORY — DX: Encounter for supervision of normal pregnancy, unspecified, unspecified trimester: Z34.90

## 2018-03-05 ENCOUNTER — Ambulatory Visit (INDEPENDENT_AMBULATORY_CARE_PROVIDER_SITE_OTHER): Payer: BLUE CROSS/BLUE SHIELD | Admitting: Advanced Practice Midwife

## 2018-03-05 ENCOUNTER — Other Ambulatory Visit (HOSPITAL_COMMUNITY)
Admission: RE | Admit: 2018-03-05 | Discharge: 2018-03-05 | Disposition: A | Discharge status: Home / Self Care | Payer: BLUE CROSS/BLUE SHIELD | Source: Ambulatory Visit | Attending: Advanced Practice Midwife | Admitting: Advanced Practice Midwife

## 2018-03-05 ENCOUNTER — Encounter: Payer: Self-pay | Admitting: Advanced Practice Midwife

## 2018-03-05 VITALS — BP 118/74 | Wt 139.0 lb

## 2018-03-05 DIAGNOSIS — Z113 Encounter for screening for infections with a predominantly sexual mode of transmission: Secondary | ICD-10-CM

## 2018-03-05 DIAGNOSIS — Z3A10 10 weeks gestation of pregnancy: Secondary | ICD-10-CM

## 2018-03-05 DIAGNOSIS — Z3481 Encounter for supervision of other normal pregnancy, first trimester: Secondary | ICD-10-CM

## 2018-03-05 DIAGNOSIS — Z1379 Encounter for other screening for genetic and chromosomal anomalies: Secondary | ICD-10-CM

## 2018-03-05 DIAGNOSIS — Z124 Encounter for screening for malignant neoplasm of cervix: Secondary | ICD-10-CM | POA: Diagnosis present

## 2018-03-05 DIAGNOSIS — Z3480 Encounter for supervision of other normal pregnancy, unspecified trimester: Secondary | ICD-10-CM | POA: Insufficient documentation

## 2018-03-05 DIAGNOSIS — O200 Threatened abortion: Secondary | ICD-10-CM | POA: Insufficient documentation

## 2018-03-05 DIAGNOSIS — R102 Pelvic and perineal pain: Secondary | ICD-10-CM | POA: Insufficient documentation

## 2018-03-05 NOTE — Patient Instructions (Signed)
Plan de alimentacin para mujeres embarazadas (Eating Plan for Pregnant Women) Durante el [place], el cuerpo Pension scheme manager recibir alimentacin extra para brindar sustento al beb que est creciendo. Se recomienda que consuma lo siguiente:  150caloras extra diariamente Physicist, medical trimestre.  300caloras extra diariamente durante el segundo trimestre.  300caloras extra diariamente durante el tercer trimestre. Comer una dieta saludable y bien equilibrada es muy importante para su salud y la del beb. Adems, usted tiene ms necesidad de recibir algunas vitaminas y [place], como cido flico, calcio, hierro y vitaminaD. QU DEBO SABER ACERCA DE LA ALIMENTACIN DURANTE EL EMBARAZO?  No trate de Geophysical data processor o hacer una dieta durante el embarazo.  Elija alimentos nutritivos y saludables. Opte por la mitad de un sndwich con un vaso de Primary school teacher de una barra de [place] o una bebida endulzada con azcar que contenga muchas caloras.  Limite el consumo general de alimentos con "caloras vacas". Entre los alimentos con poco valor nutritivo se Office Depot, los postres, los [place], las bebidas endulzadas con International aid/development worker y los alimentos fritos.  Consuma una amplia variedad de alimentos, especialmente frutas y verduras.  Tome una vitamina prenatal para ayudar a Patent examiner las necesidades adicionales durante el [place], especficamente de cido flico, hierro, calcio y vitaminaD.  Haga actividad fsica. Pdale al mdico que le recomiende ejercicios que sean especficos para usted.  Cuide la salubridad y la higiene en relacin con los alimentos, por ejemplo, lvese las manos antes de comer y despus de preparar carne cruda. Esto ayuda a USAA de origen alimentario, como la listeriosis, que puede ser muy peligrosa para el beb. Pdale al mdico ms informacin sobre esta enfermedad. CMO SE VEN 150CALORAS EXTRA? Las opciones saludables con 150caloras extra  diarias podran ser cualquiera de las siguientes:  Yogur natural con bajo contenido de grasa (6 a 8onzas [170 a 230g]) con taza de frutos rojos.  con 2cucharaditas de [place] de man.  Verduras cortadas en trozos con de taza de hummus.  Leche chocolatada con bajo contenido de grasa (8onzas [22ml] o 1taza).  1tira de queso en hebras con 1naranja mediana.  emparedado de [place] de man y [place] en pan integral (1cucharadita de [place] de man). Para llegar a 300caloras, podra comer dos de esas opciones saludables diariamente. CUL ES LA CANTIDAD SALUDABLE DE PESO QUE SE DEBE AUMENTAR? La cantidad de peso que es recomendable aumentar depende de su ndice de masa corporal [facility]) antes del embarazo. Si su IMC antes del embarazo:  Era menos de 8 (bajo peso), debe aumentar de 28 a 40 libras (13 a 18kg).  Era de 60 a 24,9 (normal), debe aumentar de 25 a 35libras (11 a 16kg).  Era de 25 a 29,9 (sobrepeso), debe aumentar de 15 a 25libras (7 a 11kg).  Era de ms de30 (obeso), debe aumentar de 11 a 20libras (5 a 10kg). QU SUCEDE SI ESTOY EMBARAZADA DE GEMELOS O MS BEBS? Generalmente, las embarazadas que tendrn gemelos o ms bebs tal vez deban aumentar la ingesta calrica diaria entre 300 y 600caloras. El rango de aumento de peso total recomendado es de 25 a 54libras (11 a 24kg), en funcin del IMC antes del embarazo. Hable con el mdico para obtener pautas especficas Rohm and Haas necesidades nutritivas [address], el aumento de peso y el ejercicio durante el [place]. QU ALIMENTOS PUEDO COMER? Cereales Atmos Energy. Intente elegir los cereales integrales, como el pan integral, la avena o el arroz integral. Verduras Todas las verduras. Intente consumir verduras  de colores y tipos diferentes para obtener una gama completa de vitaminas y minerales. Recuerde lavar bien las verduras antes de comerlas. Frutas Todas las frutas.  Intente consumir frutas de colores y tipos diferentes para obtener una gama completa de vitaminas y minerales. Recuerde lavar bien las frutas antes de comerlas. Carnes y [address] fuentes de protenas Carnes [place], entre ellas, [address], [place], pescado y cortes [place] de carne de [place], [place] o cerdo. Asegrese de que todas las carnes estn bien cocidas. Tofu. [place]. Frijoles. Huevos. [place] de man y Special educational needs teacher de frutos secos. Mariscos, por ejemplo, camarones, cangrejo y [place]. Si opta por los pescados, elija aquellos que tienen un alto contenido de cidos grasos omega-3, incluidos el salmn, el arenque, los [place], la [place], las sardinas y el abadejo. Asegrese de que todas las carnes estn cocidas a temperaturas que las vuelvan aptas para el consumo. Lcteos Leche y derivados de la leche pasteurizados. Yogur y queso pasteurizados. CSX Corporation. PPG Industries. CHS Inc. Jugos que contengan jugo de frutas o de verduras al 100%. Ts sin cafena y caf descafeinado. Se pueden tomar bebidas que contengan cafena, pero es mejor evitar esta sustancia. La ingesta total de cafena debe ser de menos de 200mg  diarios (12onzas [325ml] de caf, t o refresco) o como el mdico se lo haya indicado. Condimentos Todos los condimentos pasteurizados. Dulces y NVR Inc y los postres. Grasas y [address] las grasas y los aceites. Los artculos mencionados arriba pueden no ser Raytheon de las bebidas o los alimentos recomendados. Comunquese con el nutricionista para conocer ms opciones. QU ALIMENTOS NO SE RECOMIENDAN? Verduras Jugos de verduras no pasteurizados (crudos). Frutas Jugos de frutas no pasteurizados (crudos). Carnes y [address] fuentes de protenas Embutidos que contengan nitratos, como [place], salame y perros calientes. Embutidos, mortadela u otros fiambres (a menos que estn recalentados al punto de emitir vapor). Pats refrigerados, pastas untables  de carne de una carnicera, frutos de mar ahumados que se encuentran en la seccin de productos refrigerados de [place]. Pescado crudo, como sushi o sashimi. Pescados con alto contenido de mercurio, por ejemplo, azulejo, tiburn, pez espada y caballa. Carnes crudas, como atn o tartar de carne de res. Carnes y pollo a Merchant navy officer. Asegrese de que todas las carnes estn cocidas a temperaturas que las vuelvan aptas para el consumo. Lcteos Leche no pasteurizada (cruda) y cualquier alimento que [place]. Quesos blandos, como feta, queso [place], [place] fresco, [place], quesos [place], quesos azules y Aeronautical engineer panela (salvo que estn elaborados con leche pasteurizada, lo cual debe constar en la etiqueta). Bebidas Alcohol. Bebidas endulzadas con azcar, como refrescos, ts o bebidas energizantes. Condimentos Alimentos y bebidas caseros hechos en casa, como pickles, chucrut o bebidas con kombucha (se pueden tomar las versiones que estn pasteurizadas y se vendan en las tiendas). Otros Ensaladas preparadas en la tienda, como ensalada de [place], de [place], de [place], de atn y de frutos de Investment banker, operational. Los artculos mencionados arriba pueden no ser Raytheon de las bebidas y los alimentos que se Theatre stage manager. Comunquese con el nutricionista para obtener ms informacin. Esta informacin no tiene Theme park manager el consejo del mdico. Asegrese de hacerle al mdico cualquier pregunta que tenga. Document Released: 12/27/2013 Document Revised: 07/06/2015 Document Reviewed: 08/27/2013 Elsevier Interactive Patient Education  2018 Arvin[employer]itor. Actividad fsica durante Firefighter (Exercise During Pregnancy) Para las personas de todas las edades, la actividad fsica es un aspecto importante para mantenerse sanas. El ejercicio mejora  la actividad cardaca y la funcin pulmonar, y ayuda a Pharmacologist la fuerza, la flexibilidad y Firefighter adecuado. Adems, aumenta los niveles de energa y [place] de nimo. A la mayora de las mujeres se les recomienda seguir una rutina de ejercicios durante el [place]. Solo en contadas ocasiones y cuando hay determinadas enfermedades o complicaciones del [place], es posible que se les recomiende que limiten o eviten la actividad fsica durante el [place]. QU OTROS BENEFICIOS TIENE LA ACTIVIDAD FSICA DURANTE EL EMBARAZO? Adems de Devon Energy fuerza y la flexibilidad, la actividad fsica durante el embarazo puede contribuir a lo siguiente:  Photographer fuerza de los msculos que cumplen un papel muy importante durante el [place] de parto y [place].  Aliviar el dolor de la zona lumbar durante el embarazo.  Reducir el riesgo de tener diabetes mellitus gestacional (DMG).  Mejorar el control del nivel de azcar en la sangre (glucosa) en las mujeres que tienen diabetes mellitus gestacional.  Reducir el riesgo de tener preeclampsia, un trastorno grave que causa hipertensin arterial junto con otros sntomas, como hinchazn y dolores de [place].  Reducir el riesgo de cesrea.  Acelerar la recuperacin despus del parto. CON QU FRECUENCIA DEBO HACER EJERCICIO? Salvo que el mdico le d otras indicaciones, debe intentar hacer actividad fsica todos los [address] de la semana o casi todos. En general, intente hacer ejercicio de intensidad moderada durante aproximadamente semanales, los cuales pueden distribuirse en varios das, por ejemplo, diarios, 5veces por semana. Puede saber que est haciendo ejercicio de intensidad moderada si tiene una frecuencia cardaca ms elevada y [place] respiracin ms rpida, pero an Therapist, occupational. QU TIPOS DE EJERCICIOS DE INTENSIDAD MODERADA SE RECOMIENDAN DURANTE EL EMBARAZO? Hay muchos tipos de ejercicios que se pueden Economist. A menos que el mdico le d otras indicaciones, haga diferentes ejercicios que le aumenten la frecuencia cardaca y Air traffic controller  (cardiopulmonar) de forma segura, y la ayuden a Environmental education officer y Pharmacologist la fuerza muscular (entrenamiento de fuerza). Mientras realiza actividad fsica durante el [place], siempre debe poder decir frases completas. Algunos ejemplos de actividades fsicas que no suponen un riesgo durante el embarazo incluyen lo siguiente:  Caminatas rpidas o senderismo.  Natacin.  Gimnasia acutica.  Bicicleta fija.  Entrenamiento de fuerza.  Yoga adaptado o Pilates. Infrmele al profesor/a que est embarazada. Evite elongar demasiado y Teacher, music boca arriba durante [place].  Correr o trotar. Solo elija este tipo de [place] fsica si: ? Sola correr o trotar habitualmente antes del embarazo. ? Puede correr o trotar y, aun as, decir frases completas. QU TIPOS DE EJERCICIOS NO DEBO REALIZAR DURANTE EL EMBARAZO? En funcin de su estado fsico y de si realiz actividad fsica habitualmente antes del Psychiatrist, tal vez le recomienden Sonic Automotive ejercicios de intensidad vigorosa durante la gestacin. Puede saber que est haciendo ejercicio de intensidad vigorosa si respira con mucha ms dificultad y rapidez, y no puede Pharmacologist una conversacin. Algunos ejemplos de actividades fsicas que debe evitar durante el embarazo incluyen lo siguiente:  Deportes de contacto.  Actividades que la ponen en riesgo de sufrir cadas o recibir golpes en el abdomen, por ejemplo, esqu extremo, esqu acutico, surf, escalada en roca, ciclismo, gimnasia y equitacin.  Buceo.  Paracaidismo.  Yoga o Pilates en un recinto donde las temperaturas son extremadamente elevadas ("yoga a altas temperaturas" o "Pilates a altas temperaturas").  Trotar o correr, a menos que haya corrido o trotado habitualmente antes del Psychiatrist.  Mientras corre o trota, siempre debe poder decir frases completas. No corra ni trote con mucha energa de modo que no pueda mantener una conversacin.  Si no est habituada a hacer actividad fsica a  grandes alturas (ms de International Business Machines del mar), no lo haga durante el embarazo. CUNDO DEBO EVITAR LA ACTIVIDAD FSICA DURANTE EL EMBARAZO? Hay algunas afecciones que New York Life Insurance la actividad fsica durante el [place] sea [place], o bien aumentar el riesgo de aborto espontneo o de trabajo de parto y parto prematuros. Algunas de estas afecciones son las siguientes:  Algunos tipos de cardiopatas coronarias.  Algunos tipos de enfermedades pulmonares.  Placenta previa. Esto ocurre cuando la placenta cubre de [place] parcial o total la abertura del tero (cuello uterino).  Hemorragias vaginales frecuentes durante el embarazo.  Insuficiencia cervicouterina. Esto ocurre cuando el cuello del tero no permanece tan cerrado como debera durante el [place].  Parto prematuro.  Rotura de [place]. Esto ocurre cuando el saco de proteccin (saco amnitico) se abre y se produce la prdida del lquido amnitico por la vagina.  Recuento sanguneo muy bajo (anemia).  Preeclampsia o hipertensin arterial especfica del embarazo.  Embarazo de ms de un feto (gestacin mltiple) y el riesgo adicional de parto prematuro.  Diabetes mal controlada.  Muy bajo peso o mucho sobrepeso.  Restriccin del crecimiento intrauterino. Esto ocurre cuando el crecimiento y el desarrollo del feto durante el embarazo son ms lentos de lo previsto.  Otras enfermedades. Pregntele al mdico si alguna de estas afecciones corresponden a su caso. QU MS DEBO SABER SOBRE LA ACTIVIDAD FSICA DURANTE EL EMBARAZO? Debe tomar estas precauciones mientras hace actividad fsica durante el embarazo:  No se abrigue demasiado. ? Use ropa suelta transpirable. ? No haga actividad fsica cuando las temperaturas son dale strausser.  No se deshidrate. Beba mucha agua antes, durante y despus de hacer actividad fsica para Pharmacologist la orina de tono claro o color amarillo plido.  Evite elongar en exceso. Debido a  los cambios hormonales que ocurren durante el [place], es fcil elongar W[employer] International, los tendones y los ligamentos.  Si no sola realizar actividad fsica con frecuencia, comience despacio y pdale al mdico que le recomiende los tipos de ejercicios que sean seguros para usted. El embarazo no es la poca indicada para hacer actividad fsica con el fin de bajar de [place]. CUNDO DEBO BUSCAR ATENCIN MDICA? Debe suspender la actividad fsica y llamar al mdico si tiene sntomas fuera de lo comn, por ejemplo:  Contracciones uterinas leves o clicos abdominales.  Mareos que no mejoran con el reposo. CUNDO DEBO BUSCAR ASISTENCIA MDICA INMEDIATA? Debe suspender la actividad fsica y llamar a los servicios de Associate Professor de su localidad (911en los [place]) si tiene sntomas fuera de comn, por ejemplo:  Dolor intenso y repentino en la zona lumbar o en el abdomen.  Contracciones uterinas o clicos abdominales que no mejoran con el reposo.  Dolor en el pecho.  Hemorragia o prdida de lquido por la vagina.  Falta de aire. Esta informacin no tiene Theme park manager el consejo del mdico. Asegrese de hacerle al mdico cualquier pregunta que tenga. Document Released: 12/29/2005 Document Revised: 12/03/2014 Document Reviewed: 05/22/2014 Elsevier Interactive Patient Education  2018 Elsevier Inc. Cuidados prenatales (Prenatal Care) QU SON LOS CUIDADOS PRENATALES? Los cuidados prenatales son Sandi Mariscal se brindan a una embarazada antes del [place]. Los cuidados prenatales garantizan que la embarazada y el feto estn tan sanos como sea posible durante todo el  embarazo. Pueden brindar Goodrich Corporationeste tipo de cuidados T[place] Brookuna matrona, un mdico de atencin primaria o un especialista en parto y Psychiatristembarazo ([place]ernersvilleobstetra). Los cuidados prenatales incluyen exmenes fsicos, estudios, tratamientos e informacin sobre nutricin, estilo de vida y servicios de apoyo social. POR QU SON TAN  IMPORTANTES LOS CUIDADOS PRENATALES? Los cuidados prenatales recibidos desde un inicio y de forma peridica aumentan la probabilidad de que usted y el beb permanezcan sanos durante todo el Wilderembarazo. Este tipo de cuidados tambin reduce el riesgo de que el beb nazca mucho antes de la fecha probable de parto (prematuro) o de que sea ms pequeo de lo previsto (pequeo para la edad gestacional). Durante las visitas prenatales, se Engineer, technical salesanaliza cualquier clase de enfermedad preexistente que usted pueda tener y que represente un riesgo durante el Psychiatristembarazo. Tambin la monitorearn con regularidad para Insurance risk surveyordetectar cualquier afeccin que pueda surgir Academic librariandurante el embarazo, a fin de tratarla con rapidez y eficacia. QU SUCEDE DURANTE LAS VISITAS PRENATALES? Las visitas prenatales pueden incluir lo siguiente: Dilogo Informe al mdico cualquier signo o sntoma nuevo que haya tenido desde la ltima visita. Estos pueden incluir los siguientes:  Nuseas o vmitos.  Aumento o disminucin del nivel de Tusayanenerga.  Dificultad para dormir.  Dolor en la espalda o las piernas.  Cambios en Altria Groupel peso.  Ganas frecuentes de Geographical information systems officerorinar.  Falta de aire al realizar actividad fsica.  Cambios en la piel, por ejemplo, una erupcin cutnea o picazn.  Sangrado o flujo vaginal.  Sensacin de excitacin o nerviosismo.  Cambios en los movimientos del feto. Es conveniente que escriba cualquier pregunta o tema del que quiera hablar con el mdico, para llevarlo anotado a la cita. Exmenes Durante la primera visita prenatal, es probable que le hagan un examen fsico completo. El Office Depotmdico le revisar con frecuencia la vagina, el cuello del tero y la posicin del tero, adems de examinarle el corazn, los pulmones y otras partes del cuerpo. A medida que el embarazo avance, el mdico medir el tamao del tero y IT consultantverificar la posicin del feto dentro del tero. Tambin puede examinarla para Bed Bath & Beyonddetectar los primeros signos del Crandontrabajo de [place] M[place]parto.  Las visitas prenatales tambin pueden incluir el control de la presin arterial y, despus de 10 a 12semanas de embarazo, aproximadamente, el control de los latidos del feto. Estudios Los estudios habituales suelen incluir lo siguiente:  Anlisis de Comorosorina. Este anlisis examina la presencia de glucosa, protenas o signos de infeccin en la orina.  Recuento sanguneo. Este anlisis verifica el nivel de glbulos rojos y blancos en el organismo.  Pruebas de enfermedades de transmisin sexual (ETS). Las pruebas de Airline pilotdeteccin de ETS al comienzo del embarazo son Neomia Dearuna prctica de rutina, y en muchos estados es obligacin practicarlas.  Anlisis de anticuerpos. La examinarn para ver si es inmune a determinadas enfermedades, como la [place], que puede afectar al feto en desarrollo.  Deteccin de glucosa. Entre la semana 24y 28de embarazo, le analizarn el nivel de glucemia para detectar signos de diabetes gestacional. Pueden recomendarle un anlisis de seguimiento.  Estreptococos del grupoB. Es comn encontrar estas bacterias dentro de la vagina. Este Abbott Laboratoriesanlisis le indicar al mdico si necesita darle un antibitico para reducir la cantidad de este tipo de bacterias en el cuerpo antes del trabajo de parto y Berlinel parto.  Ecografas. Alrededor de la semana 18a 20de embarazo, muchas embarazadas se hacen ecografas para evaluar la salud del feto y Engineer, manufacturingdetectar cualquier anomala en el desarrollo.  Prueba del VIH (virus de inmunodeficiencia humana). Al comienzo del  embarazo, le harn una prueba de deteccin del VIH. Si corre un riesgo alto de [place] VIH, pueden repetirle esta prueba durante el tercer trimestre del embarazo. Pueden indicarle otro tipo de estudios segn su edad, sus antecedentes mdicos personales o familiares, u otros factores. CON QU FRECUENCIA DEBO VISITAR AL MDICO PARA LOS CUIDADOS PRENATALES? El programa de control correspondiente a los cuidados prenatales depender de cualquier enfermedad  que usted tenga desde antes del embarazo o que haya desarrollado durante el mismo. Si usted no tiene Agricultural engineer, es probable que le hagan los siguientes controles:  Physiological scientist vez al mes durante los primeros de [place].  Dos veces al mes durante el sptimo y el octavo mes de [place].  Una vez a la Boston Scientific noveno mes de Psychiatrist y [place]. Si presenta signos de trabajo de parto prematuro u otros signos o sntomas preocupantes, es posible que deba ver al mdico con ms frecuencia. Consulte al HCA Inc programa de cuidados prenatales ms adecuado para su caso. QU PUEDO HACER PARA QUE EL BEB Y YO ESTEMOS TAN SANOS COMO SEA POSIBLE DURANTE EL EMBARAZO?  Tome una vitamina prenatal que contenga (0,4mg ) de cido flico CarMax. El mdico tambin puede indicarle que tome vitaminas adicionales, como yodo, vitaminaD, hierro, cobre y zinc.  [place] de 1500 a 2000mg  de Fiserv desde la semana20 de Counsellor.  Asegrese de estar al da con las vacunas. A menos que el mdico le indique otra cosa: ? Debe aplicarse la vacuna contra la difteria, el ttanos y la tosferina (Tdap) entre la semana27 y 36de embarazo, independientemente de la fecha en la que recibi la ltima vacuna Tdap. Esta vacuna ayuda a proteger al beb contra la tosferina despus del nacimiento. ? Debe recibir una vacuna antigripal inactivada (IIV) anual como ayuda para protegerlos a usted y al beb de la gripe. Puede recibirla en cualquier momento del embarazo.  Siga una dieta bien equilibrada, que incluya lo siguiente: ? Nils Pyle y verduras frescas. ? Protenas magras. ? Alimentos con FedEx de calcio, [place], [place], quesos duros y verduras de hojas color verde oscuro. ? Panes integrales.  No coma frutos de mar con alto contenido de mercurio, por ejemplo: ? Pez espada. ? Azulejo. ? [place]. ? [place]. ? Ms de Sabino Snipes de atn  por semana.  No coma lo siguiente: ? Carnes o huevos crudos o mal cocidos. ? Alimentos no pasteurizados, como quesos blandos (brie, [place] o feta), jugos y [place]. ? Embutidos. ? Salchichas que no se cocinaron en agua hirviendo.  Beba suficiente agua para mantener la orina clara o de color amarillo plido. Para muchas mujeres, la cantidad es de 10 o ms vasos de 8onzas de Regulatory affairs officer. El hecho de mantenerse hidratada ayuda a que el feto reciba nutrientes y Network engineer el inicio de contracciones uterinas prematuras.  No consuma ningn producto que contenga tabaco, como cigarrillos, tabaco de Theatre manager o Administrator, Civil Service. Si necesita ayuda para dejar de fumar, consulte al mdico.  No consuma bebidas que contengan alcohol. No se ha determinado que haya un nivel de consumo de alcohol que sea inocuo durante el [place].  No consuma drogas. Estas pueden daar al feto en desarrollo o causar un aborto espontneo.  Consulte al mdico o al farmacutico antes de tomar cualquier medicamento recetado o de venta libre, hierbas o suplementos.  Limite el consumo de cafena a no ms de 200mg  por da.  Maricela Curet  actividad fsica. A menos que el mdico le indique otra cosa, intente hacer 30minutos de ejercicio moderado la mayora de los [address]  [address]das de la Glenwoodsemana. No practique actividades de alto impacto, deportes de contacto o actividades con alto riesgo de cadas, como equitacin o esqu extremo.  Descanse lo suficiente.  Evite todo aquello que aumente la temperatura corporal, como jacuzzis y saunas.  Si tiene un gato, no vace la bandeja sanitaria. Las bacterias presentes en las heces del gato pueden causar una infeccin llamada toxoplasmosis. Esta puede daar gravemente al feto.  Aljese de las sustancias qumicas como insecticidas, plomo y Palmermercurio, y de los productos de limpieza o pinturas que contengan solventes.  No se saque ninguna radiografa, excepto si es necesaria por razones mdicas.  Tome una  clase de preparacin para el parto y Mining engineerel amamantamiento. Pregntele al mdico si necesita una derivacin o una recomendacin.  Esta informacin no tiene Theme park managercomo fin reemplazar el consejo del mdico. Asegrese de hacerle al mdico cualquier pregunta que tenga. Document Released: 08/31/2007 Document Revised: 07/06/2015 Document Reviewed: 05/29/2013 Elsevier Interactive Patient Education  2017 ArvinMeritorElsevier Inc.

## 2018-03-05 NOTE — Progress Notes (Signed)
NOB today.  

## 2018-03-05 NOTE — Progress Notes (Signed)
New Obstetric Patient H&P    Chief Complaint: "Desires prenatal care"   History of Present Illness: Patient is a 27 y.o. G37P2002 Hispanic or Latino female, presents with amenorrhea and positive home pregnancy test. [name] last menstrual period was 12/20/2017 (exact date). and based on her  LMP, her EDD is Estimated Date of Delivery: 09/26/18 and her EGA is [redacted]w[redacted]d. She had an ultrasound at [facility] on 02/06/2018. Pregnancy was dating 6 weeks and 2 days at that time with a due date of 09/29/2017. EDD is based on LMP. Cycles are 6. days, regular, and occur approximately every : 28 to 31 days. Her last pap smear was 2014 according to Care Everywhere and was normal.    She had a urine pregnancy test which was positive 3 or 4 week(s)  ago. Her last menstrual period was normal and lasted for  6 day(s). Since her LMP she claims she has experienced breast tenderness, fatigue, nausea, vomiting. She denies vaginal bleeding. Her past medical history is noncontributory. Her prior pregnancies are notable for 2011 FT SVD 7#, 2016 FT SVD 7.5#. She denies any complications.  Since her LMP, she admits to the use of tobacco products  no She claims she has gained   4 pounds since the start of her pregnancy.  There are cats in the home in the home  no  She admits close contact with children on a regular basis  yes  She has had chicken pox in the past no She has had Tuberculosis exposures, symptoms, or previously tested positive for TB   no Current or past history of domestic violence. no  Genetic Screening/Teratology Counseling: (Includes patient, baby's father, or anyone in either family with:)   1. [name] age >/= 41 at [facility]  no 2. Thalassemia ([place], [place], Mediterranean, or Asian background): MCV<80  no 3. Neural tube defect (meningomyelocele, spina bifida, anencephaly)  no 4. Congenital heart defect  no  5. Down syndrome  no 6. Tay-Sachs (Jewish, [place]  no 7. Canavan's Disease   no 8. Sickle cell disease or trait (African)  no  9. Hemophilia or other blood disorders  no  10. Muscular dystrophy  no  11. Cystic fibrosis  no  12. Huntington's Chorea  no  13. Mental retardation/autism  no 14. Other inherited genetic or chromosomal disorder  no 15. Maternal metabolic disorder (DM, PKU, etc)  no 16. Patient or FOB with a child with a birth defect not listed above no  16a. Patient or FOB with a birth defect themselves no 17. Recurrent pregnancy loss, or stillbirth  no  18. Any medications since LMP other than prenatal vitamins (include vitamins, supplements, OTC meds, drugs, alcohol)  no 19. Any other genetic/environmental exposure to discuss  no  Infection History:   1. Lives with someone with TB or TB exposed  no  2. Patient or partner has history of genital herpes  no 3. Rash or viral illness since LMP  no 4. History of STI (GC, CT, HPV, syphilis, HIV)  no 5. History of recent travel :  no  Other pertinent information:  no     Review of Systems:10 point review of systems negative unless otherwise noted in HPI  Past Medical History:  Past Medical History:  Diagnosis Date  . Ovarian cyst     Past Surgical History:  Past Surgical History:  Procedure Laterality Date  . CHROMOPERTUBATION N/A 09/01/2017   Procedure: CHROMOPERTUBATION;  Surgeon: Christeen Douglas, MD;  Location: [facility] ORS;  Service: Gynecology;  Laterality: N/A;  . LAPAROSCOPIC OVARIAN CYSTECTOMY Left 09/01/2017   Procedure: LAPAROSCOPIC OVARIAN CYSTECTOMY;  Surgeon: Christeen Douglas, MD;  Location: [facility] ORS;  Service: Gynecology;  Laterality: Left;  . NO PAST SURGERIES      Gynecologic History: [name] last menstrual period was 12/20/2017 (exact date).  Obstetric History: [id number]  Family History:  History reviewed. No pertinent family history.  Social History:  Social History   Socioeconomic History  . Marital status: Married    Spouse name: Not on file  . Number of children: Not  on file  . Years of education: Not on file  . Highest education level: Not on file  Occupational History  . Not on file  Social Needs  . Financial resource strain: Not on file  . Food insecurity:    Worry: Not on file    Inability: Not on file  . Transportation needs:    Medical: Not on file    Non-medical: Not on file  Tobacco Use  . Smoking status: Never Smoker  . Smokeless tobacco: Never Used  Substance and Sexual Activity  . Alcohol use: Not Currently  . Drug use: Never  . Sexual activity: Yes    Birth control/protection: None  Lifestyle  . Physical activity:    Days per week: Not on file    Minutes per session: Not on file  . Stress: Not on file  Relationships  . Social connections:    Talks on phone: Not on file    Gets together: Not on file    Attends religious service: Not on file    Active member of club or organization: Not on file    Attends meetings of clubs or organizations: Not on file    Relationship status: Not on file  . Intimate partner violence:    Fear of current or ex partner: Not on file    Emotionally abused: Not on file    Physically abused: Not on file    Forced sexual activity: Not on file  Other Topics Concern  . Not on file  Social History Narrative  . Not on file    Allergies:  No Known Allergies  Medications: Prior to Admission medications   Medication Sig Start Date End Date Taking? Authorizing Provider  doxylamine, Sleep, (UNISOM) 25 MG tablet Take by mouth. 02/07/18 03/09/18 Yes [provider]  Prenatal Vit-Fe Fumarate-FA (PRENATAL VITAMIN PO) Take by mouth.   Yes [provider]  pyridOXINE (VITAMIN B-6) 25 MG tablet Take by mouth. 02/07/18  Yes [provider]  IRON PO Take 1 tablet by mouth daily.    [provider]    Physical Exam Vitals: Blood pressure 118/74, weight 139 lb (63 kg), last menstrual period 12/20/2017.  General: NAD HEENT: normocephalic, anicteric Thyroid: no  enlargement, no palpable nodules Pulmonary: No increased work of breathing, CTAB Cardiovascular: RRR, distal pulses 2+ Abdomen: NABS, soft, non-tender, non-distended.  Umbilicus without lesions.  No hepatomegaly, splenomegaly or masses palpable. No evidence of hernia  Genitourinary:  External: Normal external female genitalia.  Normal urethral meatus, normal  Bartholin's and Skene's glands.    Vagina: Normal vaginal mucosa, no evidence of prolapse.    Cervix: Grossly normal in appearance, no bleeding, no CMT  Uterus: Enlarged, mobile, normal contour.    Adnexa: ovaries non-enlarged, no adnexal masses  Rectal: deferred Extremities: no edema, erythema, or tenderness Neurologic: Grossly intact Psychiatric: mood appropriate, affect full   Assessment: 27 y.o. G3P2002 at [redacted]w[redacted]d presenting to  initiate prenatal care  Plan: 1) Avoid alcoholic beverages. 2) Patient encouraged not to smoke.  3) Discontinue the use of all non-medicinal drugs and chemicals.  4) Take prenatal vitamins daily.  5) Nutrition, food safety (fish, cheese advisories, and high nitrite foods) and exercise discussed. 6) Hospital and practice style discussed with cross coverage system.  7) Genetic Screening, such as with 1st Trimester Screening, cell free fetal DNA, AFP testing, and Ultrasound, as well as with amniocentesis and CVS as appropriate, is discussed with patient. At the conclusion of today's visit patient requested genetic testing 8) Patient is asked about travel to areas at risk for the Zika virus, and counseled to avoid travel and exposure to mosquitoes or sexual partners who may have themselves been exposed to the virus. Testing is discussed, and will be ordered as appropriate.  9) NOB labs today 10) Return in 2 weeks for 1st trimester screen and rob  Jill Montgomery, CNM [facility], [facility] 03/05/2018, 5:15 PM

## 2018-03-06 LAB — URINE DRUG PANEL 7
AMPHETAMINES, URINE: NEGATIVE ng/mL
Barbiturate Quant, Ur: NEGATIVE ng/mL
Benzodiazepine Quant, Ur: NEGATIVE ng/mL
CANNABINOID QUANT UR: NEGATIVE ng/mL
COCAINE (METAB.): NEGATIVE ng/mL
Opiate Quant, Ur: NEGATIVE ng/mL
PCP QUANT UR: NEGATIVE ng/mL

## 2018-03-07 LAB — HEMOGLOBINOPATHY EVALUATION
HEMOGLOBIN A2 QUANTITATION: 2.4 % (ref 1.8–3.2)
HGB A: 97.6 % (ref 96.4–98.8)
HGB C: 0 %
HGB S: 0 %
HGB VARIANT: 0 %
Hemoglobin F Quantitation: 0 % (ref 0.0–2.0)

## 2018-03-07 LAB — RPR+RH+ABO+RUB AB+AB SCR+CB...
Antibody Screen: NEGATIVE
HEMATOCRIT: 38.1 % (ref 34.0–46.6)
HEP B S AG: NEGATIVE
HIV Screen 4th Generation wRfx: NONREACTIVE
Hemoglobin: 12.6 g/dL (ref 11.1–15.9)
MCH: 30.7 pg (ref 26.6–33.0)
MCHC: 33.1 g/dL (ref 31.5–35.7)
MCV: 93 fL (ref 79–97)
PLATELETS: 166 10*3/uL (ref 150–450)
RBC: 4.11 x10E6/uL (ref 3.77–5.28)
RDW: 13.1 % (ref 12.3–15.4)
RH TYPE: POSITIVE
RPR: NONREACTIVE
RUBELLA: 12.4 {index} (ref >0.99)
VARICELLA: 1198 {index} (ref >165)
WBC: 7.6 10*3/uL (ref 3.4–10.8)

## 2018-03-07 LAB — URINE CULTURE

## 2018-03-12 LAB — CYTOLOGY - PAP
Chlamydia: NEGATIVE
DIAGNOSIS: NEGATIVE
HPV 16/18/45 GENOTYPING: NEGATIVE
HPV: DETECTED — AB
NEISSERIA GONORRHEA: NEGATIVE
Trichomonas: NEGATIVE

## 2018-03-22 ENCOUNTER — Other Ambulatory Visit: Payer: BLUE CROSS/BLUE SHIELD

## 2018-03-22 ENCOUNTER — Encounter: Payer: BLUE CROSS/BLUE SHIELD | Admitting: Certified Nurse Midwife

## 2018-03-23 ENCOUNTER — Other Ambulatory Visit: Payer: Self-pay | Admitting: Advanced Practice Midwife

## 2018-03-23 ENCOUNTER — Ambulatory Visit (INDEPENDENT_AMBULATORY_CARE_PROVIDER_SITE_OTHER): Payer: BLUE CROSS/BLUE SHIELD

## 2018-03-23 DIAGNOSIS — Z3682 Encounter for antenatal screening for nuchal translucency: Secondary | ICD-10-CM

## 2018-03-23 DIAGNOSIS — Z1379 Encounter for other screening for genetic and chromosomal anomalies: Secondary | ICD-10-CM

## 2018-03-23 DIAGNOSIS — Z3480 Encounter for supervision of other normal pregnancy, unspecified trimester: Secondary | ICD-10-CM

## 2018-03-23 NOTE — Progress Notes (Signed)
Order placed for 1st trimester screen

## 2018-03-27 ENCOUNTER — Ambulatory Visit (INDEPENDENT_AMBULATORY_CARE_PROVIDER_SITE_OTHER): Payer: BLUE CROSS/BLUE SHIELD | Admitting: Advanced Practice Midwife

## 2018-03-27 ENCOUNTER — Encounter: Payer: Self-pay | Admitting: Advanced Practice Midwife

## 2018-03-27 VITALS — BP 124/76 | Wt 137.0 lb

## 2018-03-27 DIAGNOSIS — Z3A13 13 weeks gestation of pregnancy: Secondary | ICD-10-CM

## 2018-03-27 DIAGNOSIS — O219 Vomiting of pregnancy, unspecified: Secondary | ICD-10-CM

## 2018-03-27 LAB — POCT URINALYSIS DIPSTICK OB
Glucose, UA: NEGATIVE
POC,PROTEIN,UA: NEGATIVE

## 2018-03-27 LAB — FIRST TRIMESTER SCREEN W/NT
CRL: 72 mm
DIA MoM: 1
DIA VALUE: 227 pg/mL
Gest Age-Collect: 13.1 weeks
HCG MOM: 0.89
HCG VALUE: 78.1 [IU]/mL
Maternal Age At EDD: 27.8 yr
Nuchal Translucency MoM: 1.07
Nuchal Translucency: 1.7 mm
Number of Fetuses: 1
PAPP-A MOM: 1.3
PAPP-A Value: 1716.2 ng/mL
Test Results:: NEGATIVE
Weight: 139 [lb_av]

## 2018-03-27 MED ORDER — DOXYLAMINE-PYRIDOXINE 10-10 MG PO TBEC: 2.0000 | DELAYED_RELEASE_TABLET | Freq: Every day | ORAL | 5 refills | Status: DC

## 2018-03-27 NOTE — Progress Notes (Signed)
  Routine Prenatal Care Visit  Subjective  Jill Montgomery is a 27 y.o. G3P2002 at 1260w6d being seen today for ongoing prenatal care.  She is currently monitored for the following issues for this low-risk pregnancy and has Supervision of normal pregnancy; Threatened abortion in first trimester; Adnexal cyst; and Adnexal tenderness on their problem list.  ----------------------------------------------------------------------------------- Patient reports nausea with vomiting in the mornings.  She request medication for nausea. Rx diclegis sent to her pharmacy.  . Vag. Bleeding: None.   . Denies leaking of fluid.  ----------------------------------------------------------------------------------- The following portions of the patient's history were reviewed and updated as appropriate: allergies, current medications, past family history, past medical history, past social history, past surgical history and problem list. Problem list updated.   Objective  Blood pressure 124/76, weight 137 lb (62.1 kg), last menstrual period 12/20/2017. Pregravid weight 135 lb (61.2 kg) Total Weight Gain 2 lb (0.907 kg) Urinalysis: Urine Protein Negative  Urine Glucose Negative  Fetal Status: Fetal Heart Rate (bpm): 156         General:  Alert, oriented and cooperative. Patient is in no acute distress.  Skin: Skin is warm and dry. No rash noted.   Cardiovascular: Normal heart rate noted  Respiratory: Normal respiratory effort, no problems with respiration noted  Abdomen: Soft, gravid, appropriate for gestational age.       Pelvic:  Cervical exam deferred        Extremities: Normal range of motion.     Mental Status: Normal mood and affect. Normal behavior. Normal judgment and thought content.   Assessment   27 y.o. [id number] at 1960w6d by  09/26/2018, by Last Menstrual Period presenting for routine prenatal visit  Plan   pregnancy Problems (from 03/05/18 to present)    No problems associated with this episode.       Preterm labor symptoms and general obstetric precautions including but not limited to vaginal bleeding, contractions, leaking of fluid and fetal movement were reviewed in detail with the patient. Please refer to After Visit Summary for other counseling recommendations.   Return in about 4 weeks (around 04/24/2018) for rob.  Tresea MallJane Kendyn Zaman, CNM 03/27/2018 3:58 PM

## 2018-03-27 NOTE — Progress Notes (Signed)
ROB

## 2018-03-27 NOTE — Patient Instructions (Signed)
Segundo trimestre de embarazo  Second Trimester of Pregnancy  El segundo trimestre va desde la semana14 hasta la 27, desde el cuarto hasta el sexto mes, y suele ser el momento en el que mejor se siente. Su organismo se ha adaptado a estar embarazada, y comienza a sentirse fsicamente mejor. En general, las nuseas matutinas han disminuido o han desaparecido completamente, puede tener ms energa y un aumento de apetito. El segundo trimestre es tambin la poca en la que el feto se desarrolla rpidamente. Hacia el final del sexto mes, el feto mide aproximadamente 9pulgadas (23cm) y pesa alrededor de 1 libras (700g). Es probable que sienta que el beb se mueve (da pataditas) entre las 16 y 20semanas del embarazo.  Cambios en el cuerpo durante el segundo trimestre  Su cuerpo continua experimentando numerosos cambios durante su segundo trimestre. Estos cambios varan de una mujer a otra.   Seguir aumentando de peso. Notar que la parte baja del abdomen sobresale.   Podrn aparecer las primeras estras en las caderas, el abdomen y las mamas.   Es posible que tenga dolores de cabeza que pueden aliviarse con ciertos medicamentos. Los medicamentos que tome deben estar aprobados por el mdico.   Tal vez tenga necesidad de orinar con ms frecuencia porque el feto est ejerciendo presin sobre la vejiga.   Debido al embarazo podr sentir acidez estomacal con frecuencia.   Puede estar estreida, ya que ciertas hormonas enlentecen los movimientos de los msculos que empujan los desechos a travs de los intestinos.   Pueden aparecer hemorroides o abultarse e hincharse las venas (venas varicosas).   Puede sentir dolor en la espalda. Esto se debe a:  ? Aumento de peso.  ? Las hormonas del embarazo relajan las articulaciones en la pelvis.  ? Un cambio en el peso y los msculos que ayudan a mantener su equilibrio.   Sus pechos seguirn creciendo y se pondrn cada vez ms sensibles.   Las encas pueden sangrar y estar  sensibles al cepillado y al hilo dental.   Pueden aparecer zonas oscuras o manchas (cloasma, mscara del embarazo) en el rostro. Esto probablemente se atenuar despus del nacimiento del beb.   Es posible que se forme una lnea oscura desde el ombligo hasta la zona del pubis (linea nigra). Esto probablemente se atenuar despus del nacimiento del beb.   Tal vez haya cambios en el cabello. Esto cambios pueden incluir su engrosamiento, crecimiento rpido y cambios en la textura. Adems, a algunas mujeres se les cae el cabello durante o despus del embarazo, o tienen el cabello seco o fino. Lo ms probable es que el cabello se le normalice despus del nacimiento del beb.  Qu debe esperar en las visitas prenatales  Durante una visita prenatal de rutina:   La pesarn para asegurarse de que usted y el feto estn creciendo normalmente.   Le tomarn la presin arterial.   Le medirn el abdomen para controlar el desarrollo del beb.   Se escucharn los latidos cardacos fetales.   Se evaluarn los resultados de los estudios solicitados en visitas anteriores.  El mdico puede preguntarle lo siguiente:   Cmo se siente.   Si siente los movimientos del beb.   Si ha tenido sntomas anormales, como prdida de lquido, sangrado, dolores de cabeza intensos o clicos abdominales.   Si est consumiendo algn producto que contenga tabaco, como cigarrillos, tabaco de mascar y cigarrillos electrnicos.   Si tiene alguna pregunta.  Otros estudios que podrn realizarse durante el   segundo trimestre incluyen lo siguiente:   Anlisis de sangre para detectar lo siguiente:  ? Concentraciones de hierro bajas (anemia).  ? Nivel alto de azcar en la sangre que afecta a las mujeres embarazadas (diabetes gestacional) entre las semanas 24 y 28.  ? Anticuerpos Rh. Esto es para detectar una protena en los glbulos rojos (factor Rh).   Anlisis de orina para detectar infecciones, diabetes o protenas en la orina.   Una ecografa  para confirmar que el beb crece y se desarrolla correctamente.   Una amniocentesis para diagnosticar posibles problemas genticos.   Estudios del feto para descartar espina bfida y sndrome de Down.   Prueba del VIH (virus de inmunodeficiencia humana). Los exmenes prenatales de rutina incluyen la prueba de deteccin del VIH, a menos que decida no realizrsela.  Siga estas indicaciones en su casa:  Medicamentos   Siga las indicaciones del mdico en relacin con el uso de medicamentos. Durante el embarazo, hay medicamentos que pueden tomarse y otros que no.   Tome vitaminas prenatales que contengan por lo menos 600microgramos (?g) de cido flico.   Si est estreida, tome un laxante suave, si el mdico lo autoriza.  Qu debe comer y beber     Lleve una dieta equilibrada que incluya gran cantidad de frutas y verduras frescas, cereales integrales, buenas fuentes de protenas como carnes magras, huevos o tofu, y lcteos descremados. El mdico la ayudar a determinar la cantidad de peso que puede aumentar.   No coma carne cruda ni quesos sin cocinar. Estos elementos contienen grmenes que pueden causar defectos congnitos en el beb.   Si no consume muchos alimentos con calcio, hable con su mdico sobre si debera tomar un suplemento diario de calcio.   Limite el consumo de alimentos con alto contenido de grasas y azcares procesados, como alimentos fritos o dulces.   Para evitar el estreimiento:  ? Bebe suficiente lquido para mantener la orina clara o de color amarillo plido.  ? Consuma alimentos ricos en fibra, como frutas y verduras frescas, cereales integrales y frijoles.  Actividad   Haga ejercicio solamente como se lo haya indicado el mdico. La mayora de las mujeres pueden continuar su rutina de ejercicios durante el embarazo. Intente realizar como mnimo 30minutos de actividad fsica por lo menos 5das a la semana. Deje de hacer ejercicio si experimenta contracciones uterinas.   No levante  objetos pesados, use zapatos de tacones bajos y mantenga una buena postura.   Puede seguir manteniendo relaciones sexuales, a menos que el mdico le indique lo contrario.  Alivio del dolor y del malestar   Use un sostn que le brinde buen soporte para prevenir las molestias causadas por la sensibilidad en los pechos.   Dese baos de asiento con agua tibia para aliviar el dolor o las molestias causadas por las hemorroides. Use una crema para las hemorroides si el mdico la autoriza.   Descanse con las piernas elevadas si tiene calambres o dolor de cintura.   Si tiene venas varicosas, use medias de descanso. Eleve los pies durante 15minutos, 3 o 4veces por da. Limite el consumo de sal en su dieta.  Cuidados prenatales   Escriba sus preguntas. Llvelas cuando concurra a las visitas prenatales.   Concurra a todas las visitas prenatales tal como se lo haya indicado el mdico. Esto es importante.  Seguridad   Use el cinturn de seguridad en todo momento mientras conduce.   Haga una lista de los nmeros de telfono de emergencia, que   incluya los nmeros de telfono de familiares, amigos, el hospital y los departamentos de polica y bomberos.  Instrucciones generales   Pdale al mdico que la derive a clases de educacin prenatal en su localidad. Debe comenzar a tomar las clases antes de que empiece el mes6 de embarazo.   Pida ayuda si tiene necesidades nutricionales o de asesoramiento durante el embarazo. El mdico puede aconsejarla o derivarla a especialistas para que la ayuden con diferentes necesidades.   No se d baos de inmersin en agua caliente, baos turcos ni saunas.   No se haga duchas vaginales ni use tampones o toallas higinicas perfumadas.   No mantenga las piernas cruzadas durante mucho tiempo.   Evite el contacto con las bandejas sanitarias de los gatos y la tierra que estos animales usan. Estos elementos contienen bacterias que pueden causar defectos congnitos al beb y la posible  prdida del feto debido a un aborto espontneo o muerte fetal.   Evite fumar, consumir hierbas, beber alcohol y tomar frmacos que no le hayan recetado. Las sustancias qumicas que estos productos contienen pueden afectar la formacin y el desarrollo del beb.   No consuma ningn producto que contenga nicotina o tabaco, como cigarrillos y cigarrillos electrnicos. Si necesita ayuda para dejar de fumar, consulte al mdico.   Visite a su dentista si an no lo ha hecho durante el embarazo. Use un cepillo de dientes blando para higienizarse los dientes y psese el hilo dental con suavidad.  Comunquese con un mdico si:   Tiene mareos.   Siente clicos leves, presin en la pelvis o dolor persistente en el abdomen.   Tiene nuseas, vmitos o diarrea persistentes.   Observa una secrecin vaginal con mal olor.   Siente dolor al orinar.  Solicite ayuda de inmediato si:   Tiene fiebre.   Tiene una prdida de lquido por la vagina.   Tiene sangrado o pequeas prdidas vaginales.   Siente dolor intenso o clicos en el abdomen.   Sube de peso o baja de peso rpidamente.   Tiene dificultad para respirar y siente dolor de pecho.   Sbitamente se le hinchan mucho el rostro, las manos, los tobillos, los pies o las piernas.   No ha sentido los movimientos del beb durante una hora.   Siente un dolor de cabeza intenso que no se alivia al tomar medicamentos.   Nota cambios en la visin.  Resumen   El segundo trimestre va desde la semana14 hasta la 27, desde el cuarto hasta el sexto mes. Es tambin una poca en la que el feto se desarrolla rpidamente.   Su organismo atraviesa por muchos cambios durante el embarazo. Estos cambios varan de una mujer a otra.   Evite fumar, consumir hierbas, beber alcohol y tomar frmacos que no le hayan recetado. Estas sustancias qumicas afectan la formacin y el desarrollo de su beb.   No consuma ningn producto que contenga tabaco, lo que incluye cigarrillos, tabaco de mascar  y cigarrillos electrnicos. Si necesita ayuda para dejar de fumar, consulte al mdico.   Comunquese con su mdico si tiene preguntas sobre esto. Concurra a todas las visitas prenatales tal como se lo haya indicado el mdico. Esto es importante.  Esta informacin no tiene como fin reemplazar el consejo del mdico. Asegrese de hacerle al mdico cualquier pregunta que tenga.  Document Released: 12/22/2004 Document Revised: 07/25/2016 Document Reviewed: 07/25/2016  Elsevier Interactive Patient Education  2019 [employer] Inc[employer]

## 2018-03-28 NOTE — L&D Delivery Note (Signed)
Delivery Note Primary OB: [facility] Provider: Avel Sensor, CNM Gestational Age: Full term Antepartum complications: None Intrapartum complications: None  I was called to see the patient, who was pushing spontaneously. As I came into the room, a viable female was delivered via vertex presentation (RN delivery). No nuchal cord was present.  I placed the infant on the maternal abdomen. The umbilical cord was doubly clamped and cut following delayed cord clamping. The placenta was delivered spontaneously and was inspected and found to be intact with a three vessel cord. The fundus was firm with massage, but there was a steady trickle of blood, so IM methergine and PR Cytotec were administered along with the IV Pitocin. The bleeding stopped following these measures. The cervix and vagina were inspected. There was a small vaginal abrasion at the introitus that did not requite repair. There were no other lacerations. Upon re-check. the fundus was firm and there was no bleeding. Patient and infant were bonding in stable condition. All counts were correct.  Apgars: 8 ,9  Weight:  6 lb 12.6 oz.    Anesthesia:  IV sedation Episiotomy:  none Lacerations: vaginal abrasion Suture Repair: None Est. Blood Loss (mL):  770  Mom to postpartum.  Baby to Couplet care / Skin to Skin.  Avel Sensor, CNM [facility], [facility] 10/04/2018  10:40 AM

## 2018-04-24 ENCOUNTER — Encounter: Payer: Self-pay | Admitting: Advanced Practice Midwife

## 2018-04-24 ENCOUNTER — Ambulatory Visit (INDEPENDENT_AMBULATORY_CARE_PROVIDER_SITE_OTHER): Payer: BLUE CROSS/BLUE SHIELD | Admitting: Advanced Practice Midwife

## 2018-04-24 VITALS — BP 120/72 | Wt 141.0 lb

## 2018-04-24 DIAGNOSIS — Z3482 Encounter for supervision of other normal pregnancy, second trimester: Secondary | ICD-10-CM

## 2018-04-24 DIAGNOSIS — Z3A17 17 weeks gestation of pregnancy: Secondary | ICD-10-CM

## 2018-04-24 DIAGNOSIS — Z3480 Encounter for supervision of other normal pregnancy, unspecified trimester: Secondary | ICD-10-CM

## 2018-04-24 LAB — POCT URINALYSIS DIPSTICK OB
Glucose, UA: NEGATIVE
PROTEIN: NEGATIVE

## 2018-04-24 NOTE — Progress Notes (Signed)
ROB

## 2018-04-24 NOTE — Progress Notes (Signed)
  Routine Prenatal Care Visit  Subjective  Jill Montgomery is a 28 y.o. G3P2002 at 1371w6d being seen today for ongoing prenatal care.  She is currently monitored for the following issues for this low-risk pregnancy and has Supervision of normal pregnancy; Threatened abortion in first trimester; Adnexal cyst; and Adnexal tenderness on their problem list.  ----------------------------------------------------------------------------------- Patient reports her work conditions are strenuous with little time for hydration and toileting. 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 She has cramping several times per day that lasts for a few seconds at a time. She denies adequate hydration due to time restrictions at the job. She is encouraged to increase her hydration. Informed her and her husband that we can write a letter with work restrictions. They are worried that it will not be honored. They decline work note at this time but will let us know if conditions worsen. Translator present at visit.     . Vag. Bleeding: None.  Movement: Present. Denies leaking of fluid.  ----------------------------------------------------------------------------------- The following portions of the patient's history were reviewed and updated as appropriate: allergies, current medications, past family history, past medical history, past social history, past surgical history and problem list. Problem list updated.   Objective  Blood pressure 120/72, weight 141 lb (64 kg), last menstrual period 12/20/2017. Pregravid weight 135 lb (61.2 kg) Total Weight Gain 6 lb (2.722 kg) Urinalysis: Urine Protein Negative  Urine Glucose Negative  Fetal Status: Fetal Heart Rate (bpm): 144   Movement: Present     General:  Alert, oriented and cooperative. Patient is in no acute distress.  Skin: Skin is warm and dry. No rash noted.   Cardiovascular: Normal heart rate noted  Respiratory: Normal  respiratory effort, no problems with respiration noted  Abdomen: Soft, gravid, appropriate for gestational age.       Pelvic:  Cervical exam deferred        Extremities: Normal range of motion.     Mental Status: Normal mood and affect. Normal behavior. Normal judgment and thought content.   Assessment   28 y.o. [id number] at 3471w6d by  09/26/2018, by Last Menstrual Period presenting for routine prenatal visit  Plan   pregnancy Problems (from 03/05/18 to present)    No problems associated with this episode.       Preterm labor symptoms and general obstetric precautions including but not limited to vaginal bleeding, contractions, leaking of fluid and fetal movement were reviewed in detail with the patient.    Return in about 3 weeks (around 05/15/2018) for  2 or 3 weeks anatomy scan and rob.  Tresea MallJane Aleigha Gilani, CNM 04/24/2018 3:45 PM

## 2018-05-08 ENCOUNTER — Ambulatory Visit (INDEPENDENT_AMBULATORY_CARE_PROVIDER_SITE_OTHER): Payer: BLUE CROSS/BLUE SHIELD | Admitting: Obstetrics and Gynecology

## 2018-05-08 ENCOUNTER — Encounter: Payer: Self-pay | Admitting: Obstetrics and Gynecology

## 2018-05-08 ENCOUNTER — Ambulatory Visit (INDEPENDENT_AMBULATORY_CARE_PROVIDER_SITE_OTHER): Payer: BLUE CROSS/BLUE SHIELD

## 2018-05-08 VITALS — BP 110/70 | Wt 141.0 lb

## 2018-05-08 DIAGNOSIS — Z3480 Encounter for supervision of other normal pregnancy, unspecified trimester: Secondary | ICD-10-CM

## 2018-05-08 DIAGNOSIS — Z3A19 19 weeks gestation of pregnancy: Secondary | ICD-10-CM

## 2018-05-08 DIAGNOSIS — Z363 Encounter for antenatal screening for malformations: Secondary | ICD-10-CM | POA: Diagnosis not present

## 2018-05-08 DIAGNOSIS — Z3402 Encounter for supervision of normal first pregnancy, second trimester: Secondary | ICD-10-CM

## 2018-05-08 NOTE — Progress Notes (Signed)
ROB/US C/O back pain and some LQ pain and pressure Decline flu

## 2018-05-08 NOTE — Progress Notes (Signed)
Routine Prenatal Care Visit  Subjective  Jill Montgomery is a 28 y.o. G3P2002 at [redacted]w[redacted]d being seen today for ongoing prenatal care.  She is currently monitored for the following issues for this low-risk pregnancy and has Supervision of normal pregnancy; Threatened abortion in first trimester; Adnexal cyst; and Adnexal tenderness on their problem list.  ----------------------------------------------------------------------------------- Patient reports backache.   Contractions: Not present. Vag. Bleeding: None.  Movement: Present. Denies leaking of fluid.  ----------------------------------------------------------------------------------- The following portions of the patient's history were reviewed and updated as appropriate: allergies, current medications, past family history, past medical history, past social history, past surgical history and problem list. Problem list updated.   Objective  Blood pressure 110/70, weight 141 lb (64 kg), last menstrual period 12/20/2017. Pregravid weight 135 lb (61.2 kg) Total Weight Gain 6 lb (2.722 kg) Urinalysis:      Fetal Status: Fetal Heart Rate (bpm): 153   Movement: Present     General:  Alert, oriented and cooperative. Patient is in no acute distress.  Skin: Skin is warm and dry. No rash noted.   Cardiovascular: Normal heart rate noted  Respiratory: Normal respiratory effort, no problems with respiration noted  Abdomen: Soft, gravid, appropriate for gestational age. Pain/Pressure: Present     Pelvic:  Cervical exam deferred        Extremities: Normal range of motion.     Mental Status: Normal mood and affect. Normal behavior. Normal judgment and thought content.     Assessment   28 y.o. G3P2002 at [redacted]w[redacted]d by  09/26/2018, by Last Menstrual Period presenting for routine prenatal visit  Plan   pregnancy Problems (from 03/05/18 to present)    Problem Noted Resolved   Supervision of normal pregnancy 02/06/2018 by Tresea Mall, CNM No   Overview Addendum 05/08/2018  4:47 PM by Natale Milch, MD    Initial [place]: 02/06/18: Uterus anteverted, Single, viable IUP, S=[redacted]w[redacted]d, FHR=125bpm, Yolk sac imaged, Cervix long and closed=4.27cm, Free fluid seen in PCDS, Rt ovarian solid cyst=2.6cm, Lt ovarian complex cyst with septations=2.4cm; septations=0.27cm & 0.27cm  Clinic [facility] Prenatal Labs  Dating  LMP = 6wk [place] Blood type: A POS   Genetic Screen 1 Screen: negative Antibody:Negative (12/09 1533)  Anatomic [place] complete Rubella: 12.40 (12/09 1533)  Varicella: Immune  GTT  Third trimester:  RPR: Non Reactive (12/09 1533)   Rhogam  not needed HBsAg: Negative (12/09 1533)   TDaP vaccine                        Flu Shot: Declines HIV: Non Reactive (12/09 1533)   Baby Food                                GBS:   Contraception  Pap: 2019 NIL HPV+  CBB     CS/VBAC NA   Support Person Partner Bill             Gestational age appropriate obstetric precautions including but not limited to vaginal bleeding, contractions, leaking of fluid and fetal movement were reviewed in detail with the patient.    Discussed supportive care of back pain. Given print out with exercises Discussed belly band. Declines PT referral at this time. Has a manual job. Does not desire a note for work for work restrictions at this time. Declined flu shot.   Return in about 4 weeks (around 06/05/2018) for ROB.   R  Jerene Pitch MD [facility], [facility] 05/08/2018, 5:06 PM

## 2018-05-08 NOTE — Patient Instructions (Signed)
Ejercicios para la espalda  Back Exercises  Si tiene dolor de espalda, haga estos ejercicios 2 o 3veces por da, o como se lo haya indicado el mdico. Cuando el dolor desaparezca, hgalos una vez por da, pero haga ms repeticiones de cada ejercicio. Si no le duele la espalda, haga estos ejercicios una vez por da o como se lo haya indicado el mdico.  Ejercicios  Rodilla al pecho  Repita estos pasos 3 o 5veces seguidas con cada pierna:  1. Acustese boca arriba sobre una cama dura o sobre el suelo con las piernas extendidas.  2. Lleve una rodilla al pecho.  3. Mantenga la rodilla contra el pecho. Para lograrlo tmese la rodilla o el muslo.  4. Tire de la rodilla hasta sentir una elongacin suave en la parte baja de la espalda.  5. Mantenga la elongacin durante 10 a 30segundos.  6. Suelte y extienda la pierna lentamente.  Inclinacin de la pelvis  Repita estos pasos 5 o 10veces seguidas:  1. Acustese boca arriba sobre una cama dura o sobre el suelo con las piernas extendidas.  2. Flexione las rodillas de manera que apunten al techo. Los pies deben estar apoyados en el suelo.  3. Contraiga los msculos de la parte baja del vientre (abdomen) para empujar la zona lumbar contra el suelo. Este movimiento har que el cccix apunte hacia el techo, en lugar de apuntar hacia abajo en direccin a los pies o al suelo.  4. Mantenga esta posicin durante 5 a 10segundos mientras contrae suavemente los msculos y respira con normalidad.  El perro y el gato  Repita estos pasos hasta que la zona lumbar se curve con ms facilidad:  1. Apoye las palmas de las manos y las rodillas sobre una superficie firme. Las manos deben estar alineadas con los hombros y las rodillas con las caderas. Puede colocarse almohadillas debajo de las rodillas.  2. Deje caer la cabeza y lleve el cccix hacia abajo de modo que apunte en direccin al suelo para que la zona lumbar se arquee como el lomo de un gato asustado.  3. Mantenga esta posicin  durante 5segundos.  4. Lentamente, levante la cabeza y lleve el cccix hacia arriba de modo que apunte en direccin al techo para que la espalda se arquee (hunda) como el lomo de un perro contento.  5. Mantenga esta posicin durante 5segundos.    Flexiones de brazos  Repita estos pasos 5 o 10veces seguidas:  1. Acustese boca abajo en el suelo.  2. Ponga las manos cerca de la cabeza, separadas aproximadamente al ancho de los hombros.  3. Con la espalda relajada y las caderas apoyadas en el suelo, extienda lentamente los brazos para levantar la mitad superior del cuerpo y elevar los hombros. No use los msculos de la espalda. Para estar ms cmodo, puede cambiar la ubicacin de las manos.  4. Mantenga esta posicin durante 5segundos.  5. Lentamente vuelva a la posicin horizontal.    Puentes  Repita estos pasos 10veces seguidas:  1. Acustese boca arriba sobre una superficie firme.  2. Flexione las rodillas de manera que apunten al techo. Los pies deben estar apoyados en el suelo.  3. Contraiga los glteos y despegue las nalgas del suelo hasta que la cintura est casi a la altura de las rodillas. Si no siente el trabajo muscular en las nalgas y la parte posterior de los muslos, aleje los pies 1 o 2pulgadas (2,5 o 5centmetros) de las nalgas.  4. Mantenga esta   posicin durante 3 a 5segundos.  5. Lentamente, vuelva a apoyar las nalgas en el suelo y relaje los glteos.  Si este ejercicio le resulta muy fcil, intente realizarlo con los brazos cruzados sobre el pecho.  Abdominales   Repita estos pasos 5 o 10veces seguidas:  1. Acustese boca arriba sobre una cama dura o sobre el suelo con las piernas extendidas.  2. Flexione las rodillas de manera que apunten al techo. Los pies deben estar apoyados en el suelo.  3. Cruce los brazos sobre el pecho.  4. Baje levemente el mentn en direccin al pecho, pero no doble el cuello.  5. Contraiga los msculos del abdomen y con lentitud eleve el pecho lo suficiente como  para despegar levemente los omplatos del suelo.  6. Lentamente baje el pecho y la cabeza hasta el suelo.  Elevaciones de espalda  Repita estos pasos 5 o 10veces seguidas:  1. Acustese boca abajo con los brazos a los costados y apoye la frente en el suelo.  2. Contraiga los msculos de las piernas y los glteos.  3. Lentamente despegue el pecho del suelo mientras mantiene las caderas apoyadas en el suelo. Mantenga la nuca alineada con la curvatura de la espalda. Mire hacia el suelo mientras hace este ejercicio.  4. Mantenga esta posicin durante 3 a 5segundos.  5. Lentamente baje el pecho y el rostro hasta el suelo.  Comunquese con un mdico si:   El dolor de espalda se vuelve mucho ms intenso cuando hace un ejercicio.   El dolor de espalda no se alivia 2horas despus de hacer los ejercicios.  Si tiene alguno de estos problemas, deje de hacer los ejercicios. No vuelva a hacer los ejercicios a menos que el mdico lo autorice.  Solicite ayuda de inmediato si:   Siente un dolor sbito y muy intenso en la espalda. Si esto ocurre, deje de hacer los ejercicios. No vuelva a hacer los ejercicios a menos que el mdico lo autorice.  Esta informacin no tiene como fin reemplazar el consejo del mdico. Asegrese de hacerle al mdico cualquier pregunta que tenga.  Document Released: 06/29/2010 Document Revised: 12/09/2016 Document Reviewed: 05/08/2014  Elsevier Interactive Patient Education  2019 Elsevier Inc[employer]

## 2018-06-05 ENCOUNTER — Encounter: Payer: Self-pay | Admitting: Advanced Practice Midwife

## 2018-06-05 ENCOUNTER — Ambulatory Visit (INDEPENDENT_AMBULATORY_CARE_PROVIDER_SITE_OTHER): Payer: BLUE CROSS/BLUE SHIELD | Admitting: Advanced Practice Midwife

## 2018-06-05 VITALS — BP 112/66 | Wt 145.0 lb

## 2018-06-05 DIAGNOSIS — Z131 Encounter for screening for diabetes mellitus: Secondary | ICD-10-CM

## 2018-06-05 DIAGNOSIS — Z3402 Encounter for supervision of normal first pregnancy, second trimester: Secondary | ICD-10-CM

## 2018-06-05 DIAGNOSIS — Z113 Encounter for screening for infections with a predominantly sexual mode of transmission: Secondary | ICD-10-CM

## 2018-06-05 DIAGNOSIS — Z3A23 23 weeks gestation of pregnancy: Secondary | ICD-10-CM

## 2018-06-05 DIAGNOSIS — Z130 Encounter for screening for diseases of the blood and blood-forming organs and certain disorders involving the immune mechanism: Secondary | ICD-10-CM

## 2018-06-05 DIAGNOSIS — Z3400 Encounter for supervision of normal first pregnancy, unspecified trimester: Secondary | ICD-10-CM

## 2018-06-05 LAB — POCT URINALYSIS DIPSTICK OB: Glucose, UA: NEGATIVE

## 2018-06-05 NOTE — Progress Notes (Signed)
ROB

## 2018-06-05 NOTE — Progress Notes (Signed)
  Routine Prenatal Care Visit  Subjective  Jill Montgomery is a 28 y.o. G3P2002 at [redacted]w[redacted]d being seen today for ongoing prenatal care.  She is currently monitored for the following issues for this low-risk pregnancy and has Supervision of normal pregnancy; Threatened abortion in first trimester; Adnexal cyst; and Adnexal tenderness on their problem list.  ----------------------------------------------------------------------------------- Patient reports low back pain and right leg pain. Comfort measures reviewed.  Contractions: Not present. Vag. Bleeding: None.  Movement: Present. Denies leaking of fluid.  ----------------------------------------------------------------------------------- The following portions of the patient's history were reviewed and updated as appropriate: allergies, current medications, past family history, past medical history, past social history, past surgical history and problem list. Problem list updated.   Objective  Blood pressure 112/66, weight 145 lb (65.8 kg), last menstrual period 12/20/2017. Pregravid weight 135 lb (61.2 kg) Total Weight Gain 10 lb (4.536 kg) Urinalysis: Urine Protein Trace  Urine Glucose Negative  Fetal Status: Fetal Heart Rate (bpm): 147 Fundal Height: 24 cm Movement: Present     General:  Alert, oriented and cooperative. Patient is in no acute distress.  Skin: Skin is warm and dry. No rash noted.   Cardiovascular: Normal heart rate noted  Respiratory: Normal respiratory effort, no problems with respiration noted  Abdomen: Soft, gravid, appropriate for gestational age. Pain/Pressure: Present     Pelvic:  Cervical exam deferred        Extremities: Normal range of motion.  Edema: None  Mental Status: Normal mood and affect. Normal behavior. Normal judgment and thought content.   Assessment   28 y.o. G3P2002 at [redacted]w[redacted]d by  09/26/2018, by Last Menstrual Period presenting for routine prenatal visit  Plan   pregnancy Problems (from 03/05/18 to  present)    Problem Noted Resolved   Supervision of normal pregnancy 02/06/2018 by Tresea Mall, CNM No   Overview Addendum 05/08/2018  4:47 PM by Natale Milch, MD    Initial [place]: 02/06/18: Uterus anteverted, Single, viable IUP, S=[redacted]w[redacted]d, FHR=125bpm, Yolk sac imaged, Cervix long and closed=4.27cm, Free fluid seen in PCDS, Rt ovarian solid cyst=2.6cm, Lt ovarian complex cyst with septations=2.4cm; septations=0.27cm & 0.27cm  Clinic [facility] Prenatal Labs  Dating  LMP = 6wk [place] Blood type: A POS   Genetic Screen 1 Screen: negative Antibody:Negative (12/09 1533)  Anatomic [place] complete Rubella: 12.40 (12/09 1533)  Varicella: Immune  GTT  Third trimester:  RPR: Non Reactive (12/09 1533)   Rhogam  not needed HBsAg: Negative (12/09 1533)   TDaP vaccine                        Flu Shot: Declines HIV: Non Reactive (12/09 1533)   Baby Food                                GBS:   Contraception  Pap: 2019 NIL HPV+  CBB     CS/VBAC NA   Support Person Partner Bill              Preterm labor symptoms and general obstetric precautions including but not limited to vaginal bleeding, contractions, leaking of fluid and fetal movement were reviewed in detail with the patient.   Return in about 4 weeks (around 07/03/2018) for 28 week labs and rob.  Tresea Mall, CNM 06/05/2018 3:43 PM

## 2018-06-25 ENCOUNTER — Telehealth: Payer: Self-pay | Admitting: Advanced Practice Midwife

## 2018-06-25 NOTE — Telephone Encounter (Signed)
Needs an interpreter, Patient is calling needing advisement in reguards to the Covid 19. Patient states she is still having work and wants to know if this is ok. Please call patient states you can leave a detail voicemail if you can't reach her.

## 2018-07-03 ENCOUNTER — Encounter: Payer: BLUE CROSS/BLUE SHIELD | Admitting: Advanced Practice Midwife

## 2018-07-03 ENCOUNTER — Other Ambulatory Visit: Payer: BLUE CROSS/BLUE SHIELD

## 2018-07-04 NOTE — Telephone Encounter (Signed)
Attempted telephone call 8 days ago- no answer. Patient was supposed to be seen by me yesterday but she did not come to her appointment.

## 2018-07-26 ENCOUNTER — Encounter: Payer: Self-pay | Admitting: Obstetrics and Gynecology

## 2018-07-26 ENCOUNTER — Other Ambulatory Visit: Payer: Self-pay

## 2018-07-26 ENCOUNTER — Ambulatory Visit (INDEPENDENT_AMBULATORY_CARE_PROVIDER_SITE_OTHER): Payer: BLUE CROSS/BLUE SHIELD | Admitting: Obstetrics and Gynecology

## 2018-07-26 ENCOUNTER — Other Ambulatory Visit: Payer: BLUE CROSS/BLUE SHIELD

## 2018-07-26 VITALS — BP 112/60 | Wt 149.0 lb

## 2018-07-26 DIAGNOSIS — Z3403 Encounter for supervision of normal first pregnancy, third trimester: Secondary | ICD-10-CM

## 2018-07-26 DIAGNOSIS — Z113 Encounter for screening for infections with a predominantly sexual mode of transmission: Secondary | ICD-10-CM

## 2018-07-26 DIAGNOSIS — Z3400 Encounter for supervision of normal first pregnancy, unspecified trimester: Secondary | ICD-10-CM

## 2018-07-26 DIAGNOSIS — Z23 Encounter for immunization: Secondary | ICD-10-CM

## 2018-07-26 DIAGNOSIS — Z3A31 31 weeks gestation of pregnancy: Secondary | ICD-10-CM

## 2018-07-26 DIAGNOSIS — Z131 Encounter for screening for diabetes mellitus: Secondary | ICD-10-CM

## 2018-07-26 DIAGNOSIS — Z130 Encounter for screening for diseases of the blood and blood-forming organs and certain disorders involving the immune mechanism: Secondary | ICD-10-CM

## 2018-07-26 NOTE — Progress Notes (Signed)
ROB/GTT C/o lower back pain, still having nausea

## 2018-07-26 NOTE — Progress Notes (Signed)
Routine Prenatal Care Visit  Subjective  Jill Montgomery is a 28 y.o. G3P2002 at [redacted]w[redacted]d being seen today for ongoing prenatal care.  She is currently monitored for the following issues for this low-risk pregnancy and has Supervision of normal pregnancy; Threatened abortion in first trimester; Adnexal cyst; and Adnexal tenderness on their problem list.  ----------------------------------------------------------------------------------- Patient reports backache.   Contractions: Irregular. Vag. Bleeding: None.  Movement: Present. Denies leaking of fluid.  ----------------------------------------------------------------------------------- The following portions of the patient's history were reviewed and updated as appropriate: allergies, current medications, past family history, past medical history, past social history, past surgical history and problem list. Problem list updated.   Objective  Blood pressure 112/60, weight 149 lb (67.6 kg), last menstrual period 12/20/2017. Pregravid weight 135 lb (61.2 kg) Total Weight Gain 14 lb (6.35 kg) Urinalysis:      Fetal Status: Fetal Heart Rate (bpm): 145 Fundal Height: 33 cm Movement: Present     General:  Alert, oriented and cooperative. Patient is in no acute distress.  Skin: Skin is warm and dry. No rash noted.   Cardiovascular: Normal heart rate noted  Respiratory: Normal respiratory effort, no problems with respiration noted  Abdomen: Soft, gravid, appropriate for gestational age. Pain/Pressure: Present     Pelvic:  Cervical exam deferred        Extremities: Normal range of motion.  Edema: None  Mental Status: Normal mood and affect. Normal behavior. Normal judgment and thought content.     Assessment   28 y.o. G3P2002 at [redacted]w[redacted]d by  09/26/2018, by Last Menstrual Period presenting for routine prenatal visit  Plan   pregnancy Problems (from 03/05/18 to present)    Problem Noted Resolved   Supervision of normal pregnancy 02/06/2018 by  Tresea Mall, CNM No   Overview Addendum 07/26/2018  3:39 PM by Natale Milch, MD    Initial [place]: 02/06/18: Uterus anteverted, Single, viable IUP, S=[redacted]w[redacted]d, FHR=125bpm, Yolk sac imaged, Cervix long and closed=4.27cm, Free fluid seen in PCDS, Rt ovarian solid cyst=2.6cm, Lt ovarian complex cyst with septations=2.4cm; septations=0.27cm & 0.27cm  [facility]inic [facility] Prenatal Labs  Dating  LMP = 6wk [place] Blood type: A POS   Genetic Screen 1 Screen: negative Antibody:Negative (12/09 1533)  Anatomic [place] complete Rubella: 12.40 (12/09 1533)  Varicella: Immune  GTT  Third trimester:  RPR: Non Reactive (12/09 1533)   Rhogam  not needed HBsAg: Negative (12/09 1533)   TDaP vaccine                        Flu Shot: Declines HIV: Non Reactive (12/09 1533)   Baby Food  Breast                              GBS:   Contraception Tubal ligation- signed consents, understands she might have to select another contraception method because PPTL are not being performed right now because of COVID 19 Pap: 2019 NIL HPV+  CBB     CS/VBAC NA   Support Person Partner Bill              Gestational age appropriate obstetric precautions including but not limited to vaginal bleeding, contractions, leaking of fluid and fetal movement were reviewed in detail with the patient.    Given Ready Set Baby information TDAP today Tubal consents today 28 week labs today  Return in about 2 weeks (around 08/09/2018) for ROB in person, interpretter .  Natale Milchhristanna R [name] MD [facility], [facility] 07/26/2018, 3:39 PM

## 2018-07-27 LAB — 28 WEEK RH+PANEL
Basophils Absolute: 0 10*3/uL (ref 0.0–0.2)
Basos: 0 %
EOS (ABSOLUTE): 0.1 10*3/uL (ref 0.0–0.4)
Eos: 2 %
Gestational Diabetes Screen: 131 mg/dL (ref 65–139)
HIV Screen 4th Generation wRfx: NONREACTIVE
Hematocrit: 34.4 % (ref 34.0–46.6)
Hemoglobin: 11.7 g/dL (ref 11.1–15.9)
Immature Grans (Abs): 0 10*3/uL (ref 0.0–0.1)
Immature Granulocytes: 1 %
Lymphocytes Absolute: 1.4 10*3/uL (ref 0.7–3.1)
Lymphs: 19 %
MCH: 30.2 pg (ref 26.6–33.0)
MCHC: 34 g/dL (ref 31.5–35.7)
MCV: 89 fL (ref 79–97)
Monocytes Absolute: 0.6 10*3/uL (ref 0.1–0.9)
Monocytes: 8 %
Neutrophils Absolute: 5.1 10*3/uL (ref 1.4–7.0)
Neutrophils: 70 %
Platelets: 151 10*3/uL (ref 150–450)
RBC: 3.88 x10E6/uL (ref 3.77–5.28)
RDW: 12.6 % (ref 11.7–15.4)
RPR Ser Ql: NONREACTIVE
WBC: 7.3 10*3/uL (ref 3.4–10.8)

## 2018-08-09 ENCOUNTER — Encounter: Payer: BLUE CROSS/BLUE SHIELD | Admitting: Obstetrics and Gynecology

## 2018-08-13 ENCOUNTER — Other Ambulatory Visit (HOSPITAL_COMMUNITY)
Admission: RE | Admit: 2018-08-13 | Discharge: 2018-08-13 | Disposition: A | Discharge status: Home / Self Care | Payer: BC Managed Care – PPO | Source: Ambulatory Visit | Attending: Obstetrics and Gynecology | Admitting: Obstetrics and Gynecology

## 2018-08-13 ENCOUNTER — Encounter: Payer: Self-pay | Admitting: Obstetrics and Gynecology

## 2018-08-13 ENCOUNTER — Other Ambulatory Visit: Payer: Self-pay

## 2018-08-13 ENCOUNTER — Ambulatory Visit (INDEPENDENT_AMBULATORY_CARE_PROVIDER_SITE_OTHER): Payer: BLUE CROSS/BLUE SHIELD | Admitting: Obstetrics and Gynecology

## 2018-08-13 VITALS — BP 118/76 | Wt 152.0 lb

## 2018-08-13 DIAGNOSIS — N898 Other specified noninflammatory disorders of vagina: Secondary | ICD-10-CM

## 2018-08-13 DIAGNOSIS — Z3A33 33 weeks gestation of pregnancy: Secondary | ICD-10-CM

## 2018-08-13 DIAGNOSIS — Z3403 Encounter for supervision of normal first pregnancy, third trimester: Secondary | ICD-10-CM | POA: Insufficient documentation

## 2018-08-13 DIAGNOSIS — O26893 Other specified pregnancy related conditions, third trimester: Secondary | ICD-10-CM

## 2018-08-13 NOTE — Progress Notes (Signed)
Routine Prenatal Care Visit  Subjective  Jill Montgomery is a 28 y.o. G3P2002 at 8263w5d being seen today for ongoing prenatal care.  She is currently monitored for the following issues for this low-risk pregnancy and has Supervision of normal pregnancy; Threatened abortion in first trimester; Adnexal cyst; and Adnexal tenderness on their problem list.  ----------------------------------------------------------------------------------- Patient reports no complaints.   Contractions: Irregular. Vag. Bleeding: None.  Movement: Present. Denies leaking of fluid.  ----------------------------------------------------------------------------------- The following portions of the patient's history were reviewed and updated as appropriate: allergies, current medications, past family history, past medical history, past social history, past surgical history and problem list. Problem list updated.   Objective  Blood pressure 118/76, weight 152 lb (68.9 kg), last menstrual period 12/20/2017. Pregravid weight 135 lb (61.2 kg) Total Weight Gain 17 lb (7.711 kg) Urinalysis: Urine Protein    Urine Glucose    Fetal Status: Fetal Heart Rate (bpm): 135 Fundal Height: 35 cm Movement: Present     General:  Alert, oriented and cooperative. Patient is in no acute distress.  Skin: Skin is warm and dry. No rash noted.   Cardiovascular: Normal heart rate noted  Respiratory: Normal respiratory effort, no problems with respiration noted  Abdomen: Soft, gravid, appropriate for gestational age. Pain/Pressure: Absent     Pelvic:  Cervical exam performed Dilation: Fingertip Effacement (%): 30 Station: Ballotable, -3  Extremities: Normal range of motion.  Edema: None  Mental Status: Normal mood and affect. Normal behavior. Normal judgment and thought content.   Assessment   28 y.o. G3P2002 at 2063w5d by  09/26/2018, by Last Menstrual Period presenting for routine prenatal visit  Plan   pregnancy Problems (from 03/05/18 to  present)    Problem Noted Resolved   Supervision of normal pregnancy 02/06/2018 by Tresea MallGledhill, Jane, CNM No   Overview Addendum 07/26/2018  5:17 PM by Natale MilchSchuman, Christanna R, MD    Initial US: 02/06/18: Uterus anteverted, Single, viable IUP, S=4533w2d, FHR=125bpm, Yolk sac imaged, Cervix long and closed=4.27cm, Free fluid seen in PCDS, Rt ovarian solid cyst=2.6cm, Lt ovarian complex cyst with septations=2.4cm; septations=0.27cm & 0.27cm  [facility]inic [facility] Prenatal Labs  Dating  LMP = 6wk US Blood type: A POS   Genetic Screen 1 Screen: negative Antibody:Negative (12/09 1533)  Anatomic US complete Rubella: 12.40 (12/09 1533)  Varicella: Immune  GTT  Third trimester:  RPR: Non Reactive (12/09 1533)   Rhogam  not needed HBsAg: Negative (12/09 1533)   TDaP vaccine   07/26/2018                     Flu Shot: Declines HIV: Non Reactive (12/09 1533)   Baby Food  Breast                              GBS:   Contraception Tubal ligation- signed consents, understands she might have to select another contraception method because PPTL are not being performed right now because of COVID 19 Pap: 2019 NIL HPV+  CBB     CS/VBAC NA   Support Person Partner Bill             Preterm labor symptoms and general obstetric precautions including but not limited to vaginal bleeding, contractions, leaking of fluid and fetal movement were reviewed in detail with the patient. Please refer to After Visit Summary for other counseling recommendations.   - wet prep shows small yeast.  Will send NAAT for GC/CT/Trich  Return in about 2 weeks (around 08/27/2018) for Routine Prenatal Appointment.  Thomasene Mohair, MD, [facility], [facility] 08/13/2018 3:28 PM

## 2018-08-15 LAB — CERVICOVAGINAL ANCILLARY ONLY
Chlamydia: NEGATIVE
Neisseria Gonorrhea: NEGATIVE
Trichomonas: NEGATIVE

## 2018-08-27 ENCOUNTER — Ambulatory Visit (INDEPENDENT_AMBULATORY_CARE_PROVIDER_SITE_OTHER): Payer: BLUE CROSS/BLUE SHIELD | Admitting: Obstetrics & Gynecology

## 2018-08-27 ENCOUNTER — Encounter: Payer: Self-pay | Admitting: Obstetrics & Gynecology

## 2018-08-27 ENCOUNTER — Other Ambulatory Visit: Payer: Self-pay

## 2018-08-27 DIAGNOSIS — Z3A35 35 weeks gestation of pregnancy: Secondary | ICD-10-CM

## 2018-08-27 DIAGNOSIS — Z3483 Encounter for supervision of other normal pregnancy, third trimester: Secondary | ICD-10-CM

## 2018-08-27 DIAGNOSIS — Z3403 Encounter for supervision of normal first pregnancy, third trimester: Secondary | ICD-10-CM

## 2018-08-27 NOTE — Progress Notes (Signed)
Virtual Visit via Telephone Note  I connected with patient on 08/27/18 at  3:10 PM EDT by telephone and verified that I am speaking with the correct person using two identifiers.   I discussed the limitations, risks, security and privacy concerns of performing an evaluation and management service by telephone and the availability of in person appointments. I also discussed with the patient that there may be a patient responsible charge related to this service. The patient expressed understanding and agreed to proceed.  The patient was at home.  AN interpreter (tele) was utilized. I spoke with the patient from my office  Jill Montgomery is a 28 y.o. G3P2002 at 4914w5d being seen today for ongoing prenatal care.  She is currently monitored for the following issues for this low-risk pregnancy and has Supervision of normal pregnancy; Threatened abortion in first trimester; Adnexal cyst; and Adnexal tenderness on their problem list.  ----------------------------------------------------------------------------------- Patient reports no complaints.   Denies pain, VB, leaking of fluid.  ----------------------------------------------------------------------------------- The following portions of the patient's history were reviewed and updated as appropriate: allergies, current medications, past family history, past medical history, past social history, past surgical history and problem list. Problem list updated.   Objective  Last menstrual period 12/20/2017. Pregravid weight 135 lb (61.2 kg) Total Weight Gain 17 lb (7.711 kg)  Physical Exam could not be performed. Because of the COVID-19 outbreak this visit was performed over the phone and not in person.   Assessment   28 y.o. G3P2002 at 6214w5d by  09/26/2018, by Last Menstrual Period presenting for routine prenatal visit  Plan   pregnancy Problems (from 03/05/18 to present)    Problem Noted Resolved   Supervision of normal pregnancy 02/06/2018 by  Tresea MallGledhill, Jane, CNM No   Overview Addendum 07/26/2018  5:17 PM by Natale MilchSchuman, Christanna R, MD    Initial US: 02/06/18: Uterus anteverted, Single, viable IUP, S=6874w2d, FHR=125bpm, Yolk sac imaged, Cervix long and closed=4.27cm, Free fluid seen in PCDS, Rt ovarian solid cyst=2.6cm, Lt ovarian complex cyst with septations=2.4cm; septations=0.27cm & 0.27cm  Clinic [facility] Prenatal Labs  Dating  LMP = 6wk US Blood type: A POS   Genetic Screen 1 Screen: negative Antibody:Negative (12/09 1533)  Anatomic US complete Rubella: 12.40 (12/09 1533)  Varicella: Immune  GTT  Third trimester: nml RPR: Non Reactive (12/09 1533)   Rhogam  not needed HBsAg: Negative (12/09 1533)   TDaP vaccine   07/26/2018                     Flu Shot: Declines HIV: Non Reactive (12/09 1533)   Baby Food  Breast                              GBS: nv  Contraception Tubal ligation Pap: 2019 NIL HPV+  CBB  no   CS/VBAC NA   Support Person Partner Bill            [facility], [facility], Labor precautions  Gestational age appropriate obstetric precautions including but not limited to vaginal bleeding, contractions, leaking of fluid and fetal movement were reviewed in detail with the patient.     Follow Up Instructions: 1 week, GBS then   I discussed the assessment and treatment plan with the patient. The patient was provided an opportunity to ask questions and all were answered. The patient agreed with the plan and demonstrated an understanding of the instructions.   The patient was advised  to call back or seek an in-person evaluation if the symptoms worsen or if the condition fails to improve as anticipated.  I provided 10 minutes of non-face-to-face time during this encounter.  Return in about 2 weeks (around 09/10/2018) for ROB in office.  Annamarie Major, MD [facility], [facility] 08/27/2018 3:43 PM

## 2018-08-31 ENCOUNTER — Other Ambulatory Visit: Payer: Self-pay | Admitting: Obstetrics & Gynecology

## 2018-08-31 ENCOUNTER — Telehealth: Payer: Self-pay | Admitting: Obstetrics & Gynecology

## 2018-08-31 NOTE — Telephone Encounter (Signed)
Pt aware letter ready for pick up 

## 2018-08-31 NOTE — Telephone Encounter (Signed)
Pt is wanting to see if she could get a note for her to go out of work. Pt feels that the baby has dropped and is causing her some discomfort. Pt is due in July. She is feeling tired.   CB# [phone]

## 2018-09-10 ENCOUNTER — Ambulatory Visit (INDEPENDENT_AMBULATORY_CARE_PROVIDER_SITE_OTHER): Payer: BLUE CROSS/BLUE SHIELD | Admitting: Obstetrics and Gynecology

## 2018-09-10 ENCOUNTER — Other Ambulatory Visit: Payer: Self-pay

## 2018-09-10 ENCOUNTER — Other Ambulatory Visit (HOSPITAL_COMMUNITY)
Admission: RE | Admit: 2018-09-10 | Discharge: 2018-09-10 | Disposition: A | Discharge status: Home / Self Care | Payer: BC Managed Care – PPO | Source: Ambulatory Visit | Attending: Obstetrics and Gynecology | Admitting: Obstetrics and Gynecology

## 2018-09-10 ENCOUNTER — Encounter: Payer: Self-pay | Admitting: Obstetrics and Gynecology

## 2018-09-10 VITALS — BP 118/60 | Wt 154.0 lb

## 2018-09-10 DIAGNOSIS — Z3403 Encounter for supervision of normal first pregnancy, third trimester: Secondary | ICD-10-CM

## 2018-09-10 DIAGNOSIS — Z3A37 37 weeks gestation of pregnancy: Secondary | ICD-10-CM

## 2018-09-10 NOTE — Progress Notes (Signed)
Routine Prenatal Care Visit  Subjective  Jill Montgomery is a 28 y.o. G3P2002 at [redacted]w[redacted]d being seen today for ongoing prenatal care.  She is currently monitored for the following issues for this low-risk pregnancy and has Supervision of normal pregnancy; Threatened abortion in first trimester; Adnexal cyst; and Adnexal tenderness on their problem list.  ----------------------------------------------------------------------------------- Patient reports no complaints.   Contractions: Irregular. Vag. Bleeding: None.  Movement: Present. Denies leaking of fluid.  ----------------------------------------------------------------------------------- The following portions of the patient's history were reviewed and updated as appropriate: allergies, current medications, past family history, past medical history, past social history, past surgical history and problem list. Problem list updated.   Objective  Blood pressure 118/60, weight 154 lb (69.9 kg), last menstrual period 12/20/2017. Pregravid weight 135 lb (61.2 kg) Total Weight Gain 19 lb (8.618 kg) Urinalysis:      Fetal Status: Fetal Heart Rate (bpm): 130 Fundal Height: 37 cm Movement: Present  Presentation: Vertex  General:  Alert, oriented and cooperative. Patient is in no acute distress.  Skin: Skin is warm and dry. No rash noted.   Cardiovascular: Normal heart rate noted  Respiratory: Normal respiratory effort, no problems with respiration noted  Abdomen: Soft, gravid, appropriate for gestational age. Pain/Pressure: Absent     Pelvic:  Cervical exam performed Dilation: 1 Effacement (%): 30 Station: -3  Extremities: Normal range of motion.     Mental Status: Normal mood and affect. Normal behavior. Normal judgment and thought content.     Assessment   28 y.o. G3P2002 at [redacted]w[redacted]d by  09/26/2018, by Last Menstrual Period presenting for routine prenatal visit  Plan   pregnancy Problems (from 03/05/18 to present)    Problem Noted Resolved    Supervision of normal pregnancy 02/06/2018 by Rod Can, CNM No   Overview Addendum 07/26/2018  5:17 PM by Homero Fellers, MD    Initial [place]: 02/06/18: Uterus anteverted, Single, viable IUP, S=[redacted]w[redacted]d, FHR=125bpm, Yolk sac imaged, Cervix long and closed=4.27cm, Free fluid seen in PCDS, Rt ovarian solid cyst=2.6cm, Lt ovarian complex cyst with septations=2.4cm; septations=0.27cm & 0.27cm  Clinic [facility] Prenatal Labs  Dating  LMP = 6wk [place] Blood type: A POS   Genetic Screen 1 Screen: negative Antibody:Negative (12/09 1533)  Anatomic [place] complete Rubella: 12.40 (12/09 1533)  Varicella: Immune  GTT  Third trimester:  RPR: Non Reactive (12/09 1533)   Rhogam  not needed HBsAg: Negative (12/09 1533)   TDaP vaccine   07/26/2018                     Flu Shot: Declines HIV: Non Reactive (12/09 1533)   Baby Food  Breast                              GBS:   Contraception Tubal ligation- signed consents, understands she might have to select another contraception method because PPTL are not being performed right now because of COVID 19 Pap: 2019 NIL HPV+  CBB     CS/VBAC NA   Support Person Partner Bill              Gestational age appropriate obstetric precautions including but not limited to vaginal bleeding, contractions, leaking of fluid and fetal movement were reviewed in detail with the patient.    GBS and GC/CT today  Return in about 2 weeks (around 09/24/2018) for ROB in person.  Homero Fellers MD [facility], [facility] 09/10/2018, 4:03 PM

## 2018-09-10 NOTE — Progress Notes (Signed)
ROB Pain in LQ

## 2018-09-12 LAB — CERVICOVAGINAL ANCILLARY ONLY
Chlamydia: NEGATIVE
Neisseria Gonorrhea: NEGATIVE

## 2018-09-13 LAB — CULTURE, BETA STREP (GROUP B ONLY): Strep Gp B Culture: POSITIVE — AB

## 2018-09-13 NOTE — Progress Notes (Signed)
Could you please call this patient and let her know she was GBS positive and will need antibiotics in labor? Thank you!!!!

## 2018-09-14 NOTE — Progress Notes (Signed)
Called pt, no answer, could not leave voice msg due to mailbox not set up. 

## 2018-09-24 ENCOUNTER — Other Ambulatory Visit: Payer: Self-pay

## 2018-09-24 ENCOUNTER — Ambulatory Visit (INDEPENDENT_AMBULATORY_CARE_PROVIDER_SITE_OTHER): Payer: BC Managed Care – PPO | Admitting: Obstetrics and Gynecology

## 2018-09-24 ENCOUNTER — Encounter: Payer: Self-pay | Admitting: Obstetrics and Gynecology

## 2018-09-24 VITALS — BP 122/68 | Wt 160.0 lb

## 2018-09-24 DIAGNOSIS — Z3A39 39 weeks gestation of pregnancy: Secondary | ICD-10-CM

## 2018-09-24 DIAGNOSIS — Z3403 Encounter for supervision of normal first pregnancy, third trimester: Secondary | ICD-10-CM

## 2018-09-24 NOTE — Progress Notes (Signed)
  [place] [facility] INDU[facility] ASSESS[facility] SCHEDULING Jill Montgomery 09-Mar-1991 Medical record #: [id number] Phone #:  Home Phone [phone]  Mobile [phone]    [facility] Proposed admission date/time:10/03/2018 @ 0800 Method of induction:Cytotec  Weight: Filed Weights06/29/20 1547Weight:160 lb (72.6 kg) BMI Body mass index is 30.23 kg/m. HIV Negative HSV Negative EDC Estimated Date of Delivery: 7/1/20based on:LMP  Gestational age on admission: [redacted]w[redacted]d Gravidity/parity:G3P2002  Cervix Score   0 1 2 3   Position Posterior Midposition Anterior   Consistency Firm Medium Soft   Effacement (%) 0-30 40-50 60-70 >80  Dilation (cm) Closed 1-2 3-4 >5  Baby's station -3 -2 -1 +1, +2   Bishop Score: low (3/30/-3)   Medical induction of labor  (dates)   Medical Indications Adapted from [place] #[id number], "Medically Indicated Late Preterm and Early Term Deliveries," 2013.  PLACENTAL / UTERINE ISSUES FETAL ISSUES MATERNAL ISSUES  ? Placenta previa (36.0-37.6) ? Isoimmunization (37.0-38.6) ? Preeclampsia without severe features or gestational HTN (37.0)  ? Suspected accreta (34.0-35.6) ? Growth Restriction Nelda [place]) ? Preeclampsia with severe features (34.0)  ? Prior classical CD, uterine window, rupture (36.0-37.6) ? Isolated (38.0-39.6) ? Chronic HTN (38.0-39.6)  ? Prior myomectomy (37.0-38.6) ? Concurrent findings (34.0-37.6) ? Cholestasis (37.0)  ? Umbilical vein varix (09.2) ? Growth Restriction (Twins) ? Diabetes  ? Placental abruption (chronic) ? Di-Di Isolated (36.0-37.6) ? Pregestational, controlled (39.0)  OBSTETRIC ISSUES ? Di-Di concurrent findings (32.0-34.6) ? Pregestational, uncontrolled (37.0-39.0)  ? Postdates ? (41 weeks) ? Mo-Di isolated (32.0-34.6) ? Pregestational, vascular compromise (37.0- 39.0)  ? PPROM (34.0) ? Multiple Gestation ? Gestational, diet controlled (40.0)  ? Hx of IUFD (39.0 weeks) ? Di-Di  (38.0-38.6) ? Gestational, med controlled (39.0)  ? Polyhydramnios, mild/moderate; SDV 8-16 or AFI 25-35 (39.0) ? Mo-Di (36.0-37.6) ? Gestational, uncontrolled (38.0-39.0)  ? Oligohydramnios (36.0-37.6); MVP <2 cm  For indications not listed above, delivery recommendations from maternal-fetal medicine consultant occurred on: na/ with Dr. Durward Parcel for indication of:n/a  Provider Signature: Prentice Docker Scheduled by:Stanton Kidney, RN Date:09/24/2018 4:05 PM   Call [phone] to finalize the induction date/time  [id number] (07/17)

## 2018-09-24 NOTE — Patient Instructions (Signed)
[facility] between 8-9 AM on 7/6 for COVID19 testing.    Stay in your car and wear a mask. Go to the [facility] on 7/8 at [facility] for your induction If you have any issues prior to this date, go through the Emergency Room.

## 2018-09-24 NOTE — Progress Notes (Signed)
Routine Prenatal Care Visit  Subjective  Jill Montgomery is a 28 y.o. G3P2002 at [redacted]w[redacted]d being seen today for ongoing prenatal care.  She is currently monitored for the following issues for this low-risk pregnancy and has Supervision of normal pregnancy; Threatened abortion in first trimester; Adnexal cyst; and Adnexal tenderness on their problem list.  ----------------------------------------------------------------------------------- Patient reports no complaints.   Contractions: Not present. Vag. Bleeding: None.  Movement: Present. Denies leaking of fluid.  ----------------------------------------------------------------------------------- The following portions of the patient's history were reviewed and updated as appropriate: allergies, current medications, past family history, past medical history, past social history, past surgical history and problem list. Problem list updated.   Objective  Blood pressure 122/68, weight 160 lb (72.6 kg), last menstrual period 12/20/2017. Pregravid weight 135 lb (61.2 kg) Total Weight Gain 25 lb (11.3 kg) Urinalysis: Urine Protein    Urine Glucose    Fetal Status: Fetal Heart Rate (bpm): 140   Movement: Present  Presentation: Vertex  General:  Alert, oriented and cooperative. Patient is in no acute distress.  Skin: Skin is warm and dry. No rash noted.   Cardiovascular: Normal heart rate noted  Respiratory: Normal respiratory effort, no problems with respiration noted  Abdomen: Soft, gravid, appropriate for gestational age. Pain/Pressure: Absent     Pelvic:  Cervical exam deferred        Extremities: Normal range of motion.  Edema: None  Mental Status: Normal mood and affect. Normal behavior. Normal judgment and thought content.   Assessment   28 y.o. G3P2002 at [redacted]w[redacted]d by  09/26/2018, by Last Menstrual Period presenting for routine prenatal visit  Plan   pregnancy Problems (from 03/05/18 to present)    Problem Noted Resolved   Supervision of normal  pregnancy 02/06/2018 by Rod Can, CNM No   Overview Addendum 09/10/2018  4:04 PM by Homero Fellers, MD    Initial [place]: 02/06/18: Uterus anteverted, Single, viable IUP, S=[redacted]w[redacted]d, FHR=125bpm, Yolk sac imaged, Cervix long and closed=4.27cm, Free fluid seen in PCDS, Rt ovarian solid cyst=2.6cm, Lt ovarian complex cyst with septations=2.4cm; septations=0.27cm & 0.27cm  Clinic [facility] Prenatal Labs  Dating  LMP = 6wk [place] Blood type: A POS   Genetic Screen 1 Screen: negative Antibody:Negative (12/09 1533)  Anatomic [place] complete Rubella: 12.40 (12/09 1533)  Varicella: Immune  GTT  Third trimester: 131 RPR: Non Reactive (12/09 1533)   Rhogam  not needed HBsAg: Negative (12/09 1533)   TDaP vaccine   07/26/2018                     Flu Shot: Declines HIV: Non Reactive (12/09 1533)   Baby Food  Breast                              GBS:   Contraception Tubal ligation- signed consents, understands she might have to select another contraception method because PPTL are not being performed right now because of COVID 19 Pap: 2019 NIL HPV+  CBB     CS/VBAC NA   Support Person Partner Bill              Term labor symptoms and general obstetric precautions including but not limited to vaginal bleeding, contractions, leaking of fluid and fetal movement were reviewed in detail with the patient. Please refer to After Visit Summary for other counseling recommendations.   - IOL scheduled for 7/8 @ 0800.  Reviewed CoVID 19 testing for 7/6 between  8-9AM. Instructions given to drive [facility] to [facility], stay in car, and wear a mask. She understands to [facility] to [facility] at [facility] appointed date with her 1 visitor. She also understands to quarantine between 7/6 and 7/9.   We also discussed that if she thinks she is in labor, her water has broken, she has bleeding, or there is concern regarding fetal movement, that she is to present to the ER whenever this may occur.  She voiced understanding and  agreement with all the above. She declined a cervical check today.   Return in about 1 week (around 10/01/2018), or if symptoms worsen or fail to improve.  Thomasene MohairStephen Alayiah Fontes, MD, Merlinda FrederickFACOG [facility], [facility] 09/24/2018 4:13 PM

## 2018-10-01 ENCOUNTER — Other Ambulatory Visit: Payer: Self-pay

## 2018-10-01 ENCOUNTER — Ambulatory Visit
Admission: RE | Admit: 2018-10-01 | Discharge: 2018-10-01 | Disposition: A | Discharge status: Home / Self Care | Payer: BC Managed Care – PPO | Source: Ambulatory Visit | Attending: Obstetrics and Gynecology | Admitting: Obstetrics and Gynecology

## 2018-10-01 DIAGNOSIS — Z1159 Encounter for screening for other viral diseases: Secondary | ICD-10-CM | POA: Insufficient documentation

## 2018-10-01 DIAGNOSIS — Z01812 Encounter for preprocedural laboratory examination: Secondary | ICD-10-CM | POA: Diagnosis present

## 2018-10-02 LAB — SARS CORONAVIRUS 2 (TAT 6-24 HRS): SARS Coronavirus 2: NEGATIVE

## 2018-10-03 ENCOUNTER — Other Ambulatory Visit: Payer: Self-pay

## 2018-10-03 ENCOUNTER — Inpatient Hospital Stay
Admission: RE | Admit: 2018-10-03 | Discharge: 2018-10-05 | DRG: 798 | Disposition: A | Discharge status: Home / Self Care | Payer: BC Managed Care – PPO | Attending: Obstetrics & Gynecology | Admitting: Obstetrics & Gynecology

## 2018-10-03 DIAGNOSIS — Z3490 Encounter for supervision of normal pregnancy, unspecified, unspecified trimester: Secondary | ICD-10-CM | POA: Diagnosis present

## 2018-10-03 DIAGNOSIS — Z302 Encounter for sterilization: Secondary | ICD-10-CM

## 2018-10-03 DIAGNOSIS — O99824 Streptococcus B carrier state complicating childbirth: Secondary | ICD-10-CM | POA: Diagnosis present

## 2018-10-03 DIAGNOSIS — Z3A41 41 weeks gestation of pregnancy: Secondary | ICD-10-CM

## 2018-10-03 DIAGNOSIS — Z3403 Encounter for supervision of normal first pregnancy, third trimester: Secondary | ICD-10-CM

## 2018-10-03 DIAGNOSIS — O480 Post-term pregnancy: Secondary | ICD-10-CM | POA: Diagnosis present

## 2018-10-03 LAB — CBC
HCT: 37.7 % (ref 36.0–46.0)
Hemoglobin: 12.2 g/dL (ref 12.0–15.0)
MCH: 28.2 pg (ref 26.0–34.0)
MCHC: 32.4 g/dL (ref 30.0–36.0)
MCV: 87.1 fL (ref 80.0–100.0)
Platelets: 146 10*3/uL — ABNORMAL LOW (ref 150–400)
RBC: 4.33 MIL/uL (ref 3.87–5.11)
RDW: 16.8 % — ABNORMAL HIGH (ref 11.5–15.5)
WBC: 6.5 10*3/uL (ref 4.0–10.5)
nRBC: 0 % (ref 0.0–0.2)

## 2018-10-03 LAB — TYPE AND SCREEN
ABO/RH(D): A POS
Antibody Screen: NEGATIVE

## 2018-10-03 MED ORDER — SODIUM CHLORIDE 0.9 % IV SOLN
5.0000 10*6.[IU] | Freq: Once | INTRAVENOUS | Status: AC
  Administered 2018-10-03: 10:00:00 5 10*6.[IU] via INTRAVENOUS
  Filled 2018-10-03: qty 5

## 2018-10-03 MED ORDER — AMMONIA AROMATIC IN INHA
RESPIRATORY_TRACT | Status: AC
  Filled 2018-10-03: qty 10

## 2018-10-03 MED ORDER — OXYTOCIN BOLUS FROM INFUSION
500.0000 mL | Freq: Once | INTRAVENOUS | Status: AC
  Administered 2018-10-04: 500 mL via INTRAVENOUS

## 2018-10-03 MED ORDER — LACTATED RINGERS IV SOLN
INTRAVENOUS | Status: DC
  Administered 2018-10-03 – 2018-10-04 (×3): via INTRAVENOUS

## 2018-10-03 MED ORDER — LIDOCAINE HCL (PF) 1 % IJ SOLN: 30.0000 mL | INTRAMUSCULAR | Status: DC | PRN

## 2018-10-03 MED ORDER — MISOPROSTOL 200 MCG PO TABS
ORAL_TABLET | ORAL | Status: AC
  Filled 2018-10-03: qty 4

## 2018-10-03 MED ORDER — MISOPROSTOL 25 MCG QUARTER TABLET
25.0000 ug | ORAL_TABLET | Freq: Once | ORAL | Status: AC
  Administered 2018-10-03: 25 ug via BUCCAL
  Filled 2018-10-03: qty 1

## 2018-10-03 MED ORDER — OXYTOCIN 10 UNIT/ML IJ SOLN
INTRAMUSCULAR | Status: AC
  Filled 2018-10-03: qty 2

## 2018-10-03 MED ORDER — PENICILLIN G 3 MILLION UNITS IVPB - SIMPLE MED
3.0000 10*6.[IU] | INTRAVENOUS | Status: DC
  Administered 2018-10-03 – 2018-10-04 (×6): 3 10*6.[IU] via INTRAVENOUS
  Filled 2018-10-03 (×5): qty 100

## 2018-10-03 MED ORDER — SOD CITRATE-CITRIC ACID 500-334 MG/5ML PO SOLN: 30.0000 mL | ORAL | Status: DC | PRN

## 2018-10-03 MED ORDER — LACTATED RINGERS IV SOLN: 500.0000 mL | INTRAVENOUS | Status: DC | PRN

## 2018-10-03 MED ORDER — LIDOCAINE HCL (PF) 1 % IJ SOLN
INTRAMUSCULAR | Status: AC
  Filled 2018-10-03: qty 30

## 2018-10-03 MED ORDER — OXYTOCIN 40 UNITS IN NORMAL SALINE INFUSION - SIMPLE MED
2.5000 [IU]/h | INTRAVENOUS | Status: DC
  Filled 2018-10-03: qty 1000

## 2018-10-03 MED ORDER — ONDANSETRON HCL 4 MG/2ML IJ SOLN: 4.0000 mg | Freq: Four times a day (QID) | INTRAMUSCULAR | Status: DC | PRN

## 2018-10-03 MED ORDER — TERBUTALINE SULFATE 1 MG/ML IJ SOLN: 0.2500 mg | Freq: Once | INTRAMUSCULAR | Status: DC | PRN

## 2018-10-03 MED ORDER — MISOPROSTOL 25 MCG QUARTER TABLET
25.0000 ug | ORAL_TABLET | ORAL | Status: DC | PRN
  Administered 2018-10-03 – 2018-10-04 (×5): 25 ug via VAGINAL
  Filled 2018-10-03 (×5): qty 1

## 2018-10-03 MED ORDER — OXYTOCIN 10 UNIT/ML IJ SOLN: 10.0000 [IU] | Freq: Once | INTRAMUSCULAR | Status: DC

## 2018-10-03 NOTE — Progress Notes (Signed)
  Labor Progress Note   28 y.o. [id number] @ [redacted]w[redacted]d , admitted for  Pregnancy, Labor Management. Postdates IOL  Subjective:  Feeling contractions and coping well  Objective:  BP 133/80 (BP Location: Left Arm)   Pulse 63   Temp 98.5 F (36.9 C) (Oral)   Resp 16   Ht 5\' 3"  (1.6 m)   Wt 72.6 kg   LMP 12/20/2017 (Exact Date)   BMI 28.34 kg/m  Abd: gravid, ND, FHT present, mild tenderness on exam Extr: no edema SVE: CERVIX: 3.5 cm dilated, 40-50 effaced, -3 station  EFM: FHR: 130 bpm, variability: moderate,  accelerations:  Present,  decelerations:  Absent Toco: Frequency: Every 2-4 minutes Labs: I have reviewed the patient's lab results.   Assessment & Plan:  [id number] @ 108w0d, admitted for  Pregnancy and Labor/Delivery Management  1. Pain management: none. 2. FWB: FHT category I.  3. ID: GBS positive 4. Labor management: continue induction with cytotec for cervical ripening  All discussed with patient, see orders   Rod Can, [facility] 10/03/2018  10:08 PM

## 2018-10-03 NOTE — H&P (Signed)
OB History & Physical   History of Present Illness:  Chief Complaint: here for induction of labor- postdates  HPI:  Jill Montgomery is a 28 y.o. 93P2002 female at 5086w0d dated by LMP.  Her pregnancy has been complicated by threatened abortion in first trimester, adnexal cyst and tenderness.    She reports a few contractions.   She denies leakage of fluid.   She denies vaginal bleeding.   She reports fetal movement.    Maternal Medical History:   Past Medical History:  Diagnosis Date  . Ovarian cyst     Past Surgical History:  Procedure Laterality Date  . CHROMOPERTUBATION N/A 09/01/2017   Procedure: CHROMOPERTUBATION;  Surgeon: Christeen DouglasBeasley, Bethany, MD;  Location: [facility] ORS;  Service: Gynecology;  Laterality: N/A;  . LAPAROSCOPIC OVARIAN CYSTECTOMY Left 09/01/2017   Procedure: LAPAROSCOPIC OVARIAN CYSTECTOMY;  Surgeon: Christeen DouglasBeasley, Bethany, MD;  Location: [facility] ORS;  Service: Gynecology;  Laterality: Left;  . NO PAST SURGERIES      No Known Allergies  Prior to Admission medications   Medication Sig Start Date End Date Taking? Authorizing Provider  Doxylamine-Pyridoxine (DICLEGIS) 10-10 MG TBEC Take 2 tablets by mouth at bedtime. If symptoms persist, add one tablet in the morning and one in the afternoon 03/27/18  Yes Tresea MallGledhill, Towanda Hornstein, CNM  Prenatal Vit-Fe Fumarate-FA (PRENATAL VITAMIN PO) Take by mouth.   Yes [provider]  Pyridoxine HCl (VITAMIN B-6 PO) Take by mouth. 02/07/18   [provider]    OB History  Gravida Para Term Preterm AB Living  3 2 2     2   SAB TAB Ectopic Multiple Live Births          2    # Outcome Date GA Lbr Len/2nd Weight Sex Delivery Anes PTL Lv  3 Current           2 Term           1 Term             Prenatal care site: [facility]  Social History: She  reports that she has never smoked. She has never used smokeless tobacco. She reports previous alcohol use. She reports that she does not use drugs.  Family History: family history is not  on file.  She does not have a family history of gynecologic cancers  Review of Systems:  Review of Systems  Constitutional: Negative.   HENT: Negative.   Eyes: Negative.   Respiratory: Negative.   Cardiovascular: Negative.   Gastrointestinal: Negative.   Genitourinary: Negative.   Musculoskeletal: Negative.   Skin: Negative.   Neurological: Negative.   Endo/Heme/Allergies: Negative.   Psychiatric/Behavioral: Negative.      Physical Exam:  BP 131/80 (BP Location: Left Arm)   Pulse 84   Temp 98.7 F (37.1 C) (Oral)   Resp 16   Ht 5\' 3"  (1.6 m)   Wt 72.6 kg   LMP 12/20/2017 (Exact Date)   BMI 28.34 kg/m   Constitutional: Well nourished, well developed female in no acute distress.  HEENT: normal Skin: Warm and dry.  Cardiovascular: Regular rate and rhythm.   Extremity: no edema  Respiratory: Clear to auscultation bilateral. Normal respiratory effort Abdomen: FHT present Back: no CVAT Neuro: DTRs 2+, Cranial nerves grossly intact Psych: Alert and Oriented x3. No memory deficits. Normal mood and affect.  MS: normal gait, normal bilateral lower extremity ROM/strength/stability.  Pelvic exam: (female chaperone present) is not limited by body habitus EGBUS: within normal limits Vagina: within normal  limits and with normal mucosa  Cervix: posterior, 2-3 cm, 30% effaced, -3 station     Pertinent Results:  Prenatal Labs: Blood type/Rh A positive  Antibody screen negative  Rubella Immune  Varicella Immune    RPR Non-reactive  HBsAg negative  HIV negative  GC negative  Chlamydia negative  Genetic screening negative  1 hour GTT 131  3 hour GTT NA  GBS positive on 6/15   Baseline FHR: 120 beats/min   Variability: moderate   Accelerations: present   Decelerations: absent Contractions: present frequency: occasional Overall assessment: reassuring   Lab Results  Component Value Date   [place] NEGATIVE 10/01/2018  ]  Assessment:  Jill Montgomery is a 28 y.o.  G41P2002 female at [redacted]w[redacted]d with postdates induction of labor.   Plan:  1. Admit to Labor & Delivery  2. CBC, T&S, Clrs, IVF 3. GBS positive: penicillin for prophylaxis Q 4 hours   4. Fetal well-being: Category 1 5. Begin induction with cytotec: 25 mcg per vagina and 25 mcg buccal, evaluate for cervical change in 4 hours or as needed  Rod Can, CNM 10/03/2018 10:02 AM

## 2018-10-04 ENCOUNTER — Encounter
Admission: RE | Disposition: A | Discharge status: Home / Self Care | Payer: Self-pay | Source: Home / Self Care | Attending: Obstetrics & Gynecology

## 2018-10-04 ENCOUNTER — Inpatient Hospital Stay: Payer: BC Managed Care – PPO | Admitting: Anesthesiology

## 2018-10-04 ENCOUNTER — Encounter: Payer: Self-pay | Admitting: Certified Nurse Midwife

## 2018-10-04 DIAGNOSIS — Z302 Encounter for sterilization: Secondary | ICD-10-CM

## 2018-10-04 HISTORY — PX: TUBAL LIGATION: SHX77

## 2018-10-04 LAB — RPR: RPR Ser Ql: NONREACTIVE

## 2018-10-04 SURGERY — LIGATION, FALLOPIAN TUBE, POSTPARTUM
Anesthesia: General | Laterality: Bilateral

## 2018-10-04 MED ORDER — ONDANSETRON HCL 4 MG PO TABS: 4.0000 mg | ORAL_TABLET | ORAL | Status: DC | PRN

## 2018-10-04 MED ORDER — FAMOTIDINE 20 MG PO TABS
40.0000 mg | ORAL_TABLET | Freq: Once | ORAL | Status: AC
  Administered 2018-10-04: 40 mg via ORAL
  Filled 2018-10-04: qty 2

## 2018-10-04 MED ORDER — SENNOSIDES-DOCUSATE SODIUM 8.6-50 MG PO TABS
2.0000 | ORAL_TABLET | ORAL | Status: DC
  Administered 2018-10-05: 2 via ORAL
  Filled 2018-10-04: qty 2

## 2018-10-04 MED ORDER — COCONUT OIL OIL
1.0000 "application " | TOPICAL_OIL | Status: DC | PRN
  Administered 2018-10-05: 1 via TOPICAL
  Filled 2018-10-04: qty 120

## 2018-10-04 MED ORDER — KETOROLAC TROMETHAMINE 30 MG/ML IJ SOLN
INTRAMUSCULAR | Status: DC | PRN
  Administered 2018-10-04: 30 mg via INTRAVENOUS

## 2018-10-04 MED ORDER — DIPHENHYDRAMINE HCL 25 MG PO CAPS: 25.0000 mg | ORAL_CAPSULE | Freq: Four times a day (QID) | ORAL | Status: DC | PRN

## 2018-10-04 MED ORDER — DIBUCAINE (PERIANAL) 1 % EX OINT: 1.0000 "application " | TOPICAL_OINTMENT | CUTANEOUS | Status: DC | PRN

## 2018-10-04 MED ORDER — FENTANYL CITRATE (PF) 100 MCG/2ML IJ SOLN: 25.0000 ug | INTRAMUSCULAR | Status: DC | PRN

## 2018-10-04 MED ORDER — LIDOCAINE HCL (CARDIAC) PF 100 MG/5ML IV SOSY
PREFILLED_SYRINGE | INTRAVENOUS | Status: DC | PRN
  Administered 2018-10-04: 50 mg via INTRAVENOUS

## 2018-10-04 MED ORDER — SUGAMMADEX SODIUM 200 MG/2ML IV SOLN
INTRAVENOUS | Status: AC
  Filled 2018-10-04: qty 2

## 2018-10-04 MED ORDER — WITCH HAZEL-GLYCERIN EX PADS: 1.0000 "application " | MEDICATED_PAD | CUTANEOUS | Status: DC | PRN

## 2018-10-04 MED ORDER — SUCCINYLCHOLINE CHLORIDE 20 MG/ML IJ SOLN
INTRAMUSCULAR | Status: AC
  Filled 2018-10-04: qty 1

## 2018-10-04 MED ORDER — CARBOPROST TROMETHAMINE 250 MCG/ML IM SOLN
INTRAMUSCULAR | Status: AC
  Filled 2018-10-04: qty 1

## 2018-10-04 MED ORDER — OXYCODONE-ACETAMINOPHEN 5-325 MG PO TABS
1.0000 | ORAL_TABLET | ORAL | Status: DC | PRN
  Administered 2018-10-04 – 2018-10-05 (×2): 1 via ORAL
  Filled 2018-10-04 (×2): qty 1

## 2018-10-04 MED ORDER — SUCCINYLCHOLINE CHLORIDE 20 MG/ML IJ SOLN
INTRAMUSCULAR | Status: DC | PRN
  Administered 2018-10-04: 100 mg via INTRAVENOUS

## 2018-10-04 MED ORDER — ROCURONIUM BROMIDE 100 MG/10ML IV SOLN
INTRAVENOUS | Status: AC
  Filled 2018-10-04: qty 1

## 2018-10-04 MED ORDER — IBUPROFEN 600 MG PO TABS
600.0000 mg | ORAL_TABLET | Freq: Four times a day (QID) | ORAL | Status: DC
  Administered 2018-10-04 – 2018-10-05 (×4): 600 mg via ORAL
  Filled 2018-10-04 (×4): qty 1

## 2018-10-04 MED ORDER — FENTANYL CITRATE (PF) 100 MCG/2ML IJ SOLN
INTRAMUSCULAR | Status: DC | PRN
  Administered 2018-10-04 (×2): 50 ug via INTRAVENOUS

## 2018-10-04 MED ORDER — OXYCODONE HCL 5 MG PO TABS: 5.0000 mg | ORAL_TABLET | Freq: Once | ORAL | Status: DC | PRN

## 2018-10-04 MED ORDER — KETOROLAC TROMETHAMINE 30 MG/ML IJ SOLN
INTRAMUSCULAR | Status: AC
  Filled 2018-10-04: qty 1

## 2018-10-04 MED ORDER — PROPOFOL 10 MG/ML IV BOLUS
INTRAVENOUS | Status: AC
  Filled 2018-10-04: qty 20

## 2018-10-04 MED ORDER — ACETAMINOPHEN 325 MG PO TABS: 650.0000 mg | ORAL_TABLET | ORAL | Status: DC | PRN

## 2018-10-04 MED ORDER — ONDANSETRON HCL 4 MG/2ML IJ SOLN
INTRAMUSCULAR | Status: AC
  Filled 2018-10-04: qty 2

## 2018-10-04 MED ORDER — LIDOCAINE HCL (PF) 2 % IJ SOLN
INTRAMUSCULAR | Status: AC
  Filled 2018-10-04: qty 10

## 2018-10-04 MED ORDER — DEXAMETHASONE SODIUM PHOSPHATE 10 MG/ML IJ SOLN
INTRAMUSCULAR | Status: AC
  Filled 2018-10-04: qty 1

## 2018-10-04 MED ORDER — LACTATED RINGERS IV SOLN
INTRAVENOUS | Status: DC
  Administered 2018-10-04 (×2): 20 mL/h via INTRAVENOUS

## 2018-10-04 MED ORDER — BUTORPHANOL TARTRATE 2 MG/ML IJ SOLN
1.0000 mg | INTRAMUSCULAR | Status: DC | PRN
  Administered 2018-10-04: 1 mg via INTRAVENOUS
  Filled 2018-10-04: qty 1

## 2018-10-04 MED ORDER — SIMETHICONE 80 MG PO CHEW: 80.0000 mg | CHEWABLE_TABLET | ORAL | Status: DC | PRN

## 2018-10-04 MED ORDER — ROCURONIUM BROMIDE 100 MG/10ML IV SOLN
INTRAVENOUS | Status: DC | PRN
  Administered 2018-10-04: 25 mg via INTRAVENOUS
  Administered 2018-10-04: 5 mg via INTRAVENOUS

## 2018-10-04 MED ORDER — GLYCOPYRROLATE 0.2 MG/ML IJ SOLN
INTRAMUSCULAR | Status: DC | PRN
  Administered 2018-10-04: 0.2 mg via INTRAVENOUS

## 2018-10-04 MED ORDER — DEXAMETHASONE SODIUM PHOSPHATE 10 MG/ML IJ SOLN
INTRAMUSCULAR | Status: DC | PRN
  Administered 2018-10-04: 10 mg via INTRAVENOUS

## 2018-10-04 MED ORDER — BENZOCAINE-MENTHOL 20-0.5 % EX AERO: 1.0000 "application " | INHALATION_SPRAY | CUTANEOUS | Status: DC | PRN

## 2018-10-04 MED ORDER — MISOPROSTOL 200 MCG PO TABS
ORAL_TABLET | ORAL | Status: AC
  Administered 2018-10-04: 10:00:00 800 ug via RECTAL
  Filled 2018-10-04: qty 5

## 2018-10-04 MED ORDER — METHYLERGONOVINE MALEATE 0.2 MG/ML IJ SOLN
INTRAMUSCULAR | Status: AC
  Administered 2018-10-04: 10:00:00 0.2 mg via INTRAMUSCULAR
  Filled 2018-10-04: qty 1

## 2018-10-04 MED ORDER — PROPOFOL 10 MG/ML IV BOLUS
INTRAVENOUS | Status: DC | PRN
  Administered 2018-10-04: 120 mg via INTRAVENOUS

## 2018-10-04 MED ORDER — PROMETHAZINE HCL 25 MG/ML IJ SOLN: 6.2500 mg | INTRAMUSCULAR | Status: DC | PRN

## 2018-10-04 MED ORDER — SENNOSIDES-DOCUSATE SODIUM 8.6-50 MG PO TABS: 2.0000 | ORAL_TABLET | ORAL | Status: DC

## 2018-10-04 MED ORDER — SUGAMMADEX SODIUM 200 MG/2ML IV SOLN
INTRAVENOUS | Status: DC | PRN
  Administered 2018-10-04: 200 mg via INTRAVENOUS

## 2018-10-04 MED ORDER — GLYCOPYRROLATE 0.2 MG/ML IJ SOLN
INTRAMUSCULAR | Status: AC
  Filled 2018-10-04: qty 1

## 2018-10-04 MED ORDER — BUPIVACAINE HCL 0.5 % IJ SOLN
INTRAMUSCULAR | Status: DC | PRN
  Administered 2018-10-04: 5 mL

## 2018-10-04 MED ORDER — ONDANSETRON HCL 4 MG/2ML IJ SOLN
INTRAMUSCULAR | Status: DC | PRN
  Administered 2018-10-04: 4 mg via INTRAVENOUS

## 2018-10-04 MED ORDER — PRENATAL MULTIVITAMIN CH
1.0000 | ORAL_TABLET | Freq: Every day | ORAL | Status: DC
  Administered 2018-10-05: 1 via ORAL
  Filled 2018-10-04: qty 1

## 2018-10-04 MED ORDER — OXYCODONE HCL 5 MG/5ML PO SOLN: 5.0000 mg | Freq: Once | ORAL | Status: DC | PRN

## 2018-10-04 MED ORDER — OXYCODONE-ACETAMINOPHEN 5-325 MG PO TABS: 2.0000 | ORAL_TABLET | ORAL | Status: DC | PRN

## 2018-10-04 MED ORDER — ONDANSETRON HCL 4 MG/2ML IJ SOLN: 4.0000 mg | INTRAMUSCULAR | Status: DC | PRN

## 2018-10-04 MED ORDER — METOCLOPRAMIDE HCL 10 MG PO TABS
10.0000 mg | ORAL_TABLET | Freq: Once | ORAL | Status: AC
  Administered 2018-10-04: 10 mg via ORAL
  Filled 2018-10-04 (×2): qty 1

## 2018-10-04 MED ORDER — FENTANYL CITRATE (PF) 100 MCG/2ML IJ SOLN
INTRAMUSCULAR | Status: AC
  Filled 2018-10-04: qty 2

## 2018-10-04 SURGICAL SUPPLY — 24 items
CHLORAPREP W/TINT 26 (MISCELLANEOUS) ×3 IMPLANT
COVER WAND RF STERILE (DRAPES) IMPLANT
DERMABOND ADVANCED (GAUZE/BANDAGES/DRESSINGS) ×2
DERMABOND ADVANCED .7 DNX12 (GAUZE/BANDAGES/DRESSINGS) ×1 IMPLANT
DRAPE LAPAROTOMY 100X77 ABD (DRAPES) ×3 IMPLANT
DRSG TEGADERM 2-3/8X2-3/4 SM (GAUZE/BANDAGES/DRESSINGS) ×3 IMPLANT
DRSG TELFA 4X3 1S NADH ST (GAUZE/BANDAGES/DRESSINGS) ×3 IMPLANT
ELECT CAUTERY BLADE 6.4 (BLADE) ×3 IMPLANT
ELECT REM PT RETURN 9FT ADLT (ELECTROSURGICAL) ×3
ELECTRODE REM PT RTRN 9FT ADLT (ELECTROSURGICAL) ×1 IMPLANT
GLOVE BIO SURGEON STRL SZ8 (GLOVE) ×3 IMPLANT
GOWN STRL REUS W/ TWL LRG LVL3 (GOWN DISPOSABLE) ×1 IMPLANT
GOWN STRL REUS W/ TWL XL LVL3 (GOWN DISPOSABLE) ×1 IMPLANT
GOWN STRL REUS W/TWL LRG LVL3 (GOWN DISPOSABLE) ×2
GOWN STRL REUS W/TWL XL LVL3 (GOWN DISPOSABLE) ×2
LABEL OR SOLS (LABEL) ×3 IMPLANT
NEEDLE HYPO 22GX1.5 SAFETY (NEEDLE) ×3 IMPLANT
NS IRRIG 500ML POUR BTL (IV SOLUTION) ×3 IMPLANT
PACK BASIN MINOR ARMC (MISCELLANEOUS) ×3 IMPLANT
STRAP SAFETY 5IN WIDE (MISCELLANEOUS) ×3 IMPLANT
SUT VIC AB 0 SH 27 (SUTURE) ×6 IMPLANT
SUT VIC AB 2-0 UR6 27 (SUTURE) ×3 IMPLANT
SUT VIC AB 4-0 PS2 18 (SUTURE) IMPLANT
SYR 10ML LL (SYRINGE) ×3 IMPLANT

## 2018-10-04 NOTE — Transfer of Care (Signed)
Immediate Anesthesia Transfer of Care Note  Patient: Jill Montgomery  Procedure(s) Performed: POST PARTUM TUBAL LIGATION (Bilateral )  Patient Location: PACU  Anesthesia Type:General  Level of Consciousness: awake and alert   Airway & Oxygen Therapy: Patient Spontanous Breathing and Patient connected to face mask oxygen  Post-op Assessment: Report given to RN and Post -op Vital signs reviewed and stable  Post vital signs: Reviewed  Last Vitals:  Vitals Value Taken Time  BP 121/78 10/04/18 1717  Temp    Pulse 81 10/04/18 1717  Resp 20 10/04/18 1717  SpO2 99 % 10/04/18 1717  Vitals shown include unvalidated device data.  Last Pain:  Vitals:   10/04/18 1443  TempSrc: Oral  PainSc:       Patients Stated Pain Goal: 0 (50/53/97 6734)  Complications: No apparent anesthesia complications

## 2018-10-04 NOTE — H&P (View-Only) (Signed)
PRE-OPERATIVE CONSULTATION  HPI:  Jill Montgomery is a 28 y.o. G3P3003 [name] last menstrual period was 12/20/2017 (exact date).; she is being admitted for surgery related to requested sterilization.  She just delivered today, third baby, does not desire any more pregnancies.  She has been counseled as to having permanent sterilization throughout pregnancy, and continues to desire this for contraception.  Interpret used to obtain consent and ensures this is her wish.  PMHx: Past Medical History:  Diagnosis Date  . Ovarian cyst    Past Surgical History:  Procedure Laterality Date  . CHROMOPERTUBATION N/A 09/01/2017   Procedure: CHROMOPERTUBATION;  Surgeon: Benjaman Kindler, MD;  Location: [facility] ORS;  Service: Gynecology;  Laterality: N/A;  . LAPAROSCOPIC OVARIAN CYSTECTOMY Left 09/01/2017   Procedure: LAPAROSCOPIC OVARIAN CYSTECTOMY;  Surgeon: Benjaman Kindler, MD;  Location: [facility] ORS;  Service: Gynecology;  Laterality: Left;  . NO PAST SURGERIES     History reviewed. No pertinent family history. Social History   Tobacco Use  . Smoking status: Never Smoker  . Smokeless tobacco: Never Used  Substance Use Topics  . Alcohol use: Not Currently  . Drug use: Never    Current Facility-Administered Medications:  .  acetaminophen (TYLENOL) tablet 650 mg, 650 mg, Oral, Q4H PRN, Rexene Agent, CNM .  benzocaine-Menthol (DERMOPLAST) 20-0.5 % topical spray 1 application, 1 application, Topical, PRN, Rexene Agent, CNM .  coconut oil, 1 application, Topical, PRN, Rexene Agent, CNM .  witch hazel-glycerin (TUCKS) pad 1 application, 1 application, Topical, PRN **AND** dibucaine (NUPERCAINAL) 1 % rectal ointment 1 application, 1 application, Rectal, PRN, Rexene Agent, CNM .  diphenhydrAMINE (BENADRYL) capsule 25 mg, 25 mg, Oral, Q6H PRN, Rexene Agent, CNM .  ibuprofen (ADVIL) tablet 600 mg, 600 mg, Oral, Q6H, Rexene Agent, CNM, Stopped at 10/04/18 1315 .  lactated ringers  infusion, , Intravenous, Continuous, Rexene Agent, CNM .  ondansetron (ZOFRAN) tablet 4 mg, 4 mg, Oral, Q4H PRN **OR** ondansetron (ZOFRAN) injection 4 mg, 4 mg, Intravenous, Q4H PRN, Rexene Agent, CNM .  oxyCODONE-acetaminophen (PERCOCET/ROXICET) 5-325 MG per tablet 1 tablet, 1 tablet, Oral, Q4H PRN, Rexene Agent, CNM .  oxyCODONE-acetaminophen (PERCOCET/ROXICET) 5-325 MG per tablet 2 tablet, 2 tablet, Oral, Q4H PRN, Rexene Agent, CNM .  prenatal multivitamin tablet 1 tablet, 1 tablet, Oral, Q1200, Rexene Agent, CNM .  senna-docusate (Senokot-S) tablet 2 tablet, 2 tablet, Oral, Q24H, Shanlever, Pierce Crane, RPH .  simethicone (MYLICON) chewable tablet 80 mg, 80 mg, Oral, PRN, Rexene Agent, CNM Allergies: Patient has no known allergies.  Review of Systems  All other systems reviewed and are negative.   Objective: BP 123/74 (BP Location: Left Arm)   Pulse 62   Temp 98.9 F (37.2 C) (Oral)   Resp 18   Ht 5\' 3"  (1.6 m)   Wt 72.6 kg   LMP 12/20/2017 (Exact Date)   SpO2 99% Comment: Room Air.  Breastfeeding Unknown   BMI 28.34 kg/m   Filed Weights   10/03/18 0831  Weight: 72.6 kg   Physical Exam Constitutional:      General: She is not in acute distress.    Appearance: She is well-developed.  HENT:     Head: Normocephalic and atraumatic. No laceration.     Right Ear: Hearing normal.     Left Ear: Hearing normal.     Mouth/Throat:     Pharynx: Uvula midline.  Eyes:     Pupils: Pupils  are equal, round, and reactive to light.  Neck:     Musculoskeletal: Normal range of motion and neck supple.     Thyroid: No thyromegaly.  Cardiovascular:     Rate and Rhythm: Normal rate and regular rhythm.     Heart sounds: No murmur. No friction rub. No gallop.   Pulmonary:     Effort: Pulmonary effort is normal. No respiratory distress.     Breath sounds: Normal breath sounds. No wheezing.  Abdominal:     General: Bowel sounds are normal. There is no  distension.     Palpations: Abdomen is soft.     Tenderness: There is no abdominal tenderness. There is no rebound.  Musculoskeletal: Normal range of motion.  Neurological:     Mental Status: She is alert and oriented to person, place, and time.     Cranial Nerves: No cranial nerve deficit.  Skin:    General: Skin is warm and dry.  Psychiatric:        Judgment: Judgment normal.  Vitals signs reviewed.    Assessment:Desire for sterility post partum  The patient has been fully informed about all methods of contraception, both temporary and permanent. She understands that tubal ligation is meant to be permanent, absolute and irreversible. She was told that there is an approximately 1 in 400 chance of a pregnancy in the future after tubal ligation. She was told the short and long term complications of tubal ligation. She understands the risks from this surgery include, but are not limited to, the risks of anesthesia, hemorrhage, infection, perforation, and injury to adjacent structures, bowel, bladder and blood vessels.    Annamarie MajorPaul Harris, MD, Merlinda FrederickFACOG [facility], [facility] 10/04/2018  3:06 PM

## 2018-10-04 NOTE — Discharge Summary (Signed)
OB Discharge Summary     Patient Name: Jill Montgomery DOB: [birth year] MRN: [id number]  Date of admission: 10/03/2018 Delivering Provider: Oswaldo ConroyJacelyn Y Schmid, CNM  Date of Delivery: 10/04/2018  Date of Surgery: 10/04/2018 Date of discharge: 10/05/2018  Admitting diagnosis: Labor induction Intrauterine pregnancy: [id number]     Secondary diagnosis: Desires sterilization     Discharge diagnosis: Term Pregnancy Delivered, Postpartum bilateral tubal ligation                         Hospital course:  Induction of Labor With Vaginal Delivery   28 y.o. yo G3P3003 at 4159w1d was admitted to the hospital 10/03/2018 for induction of labor.  Indication for induction: Postdates.  Patient had an uncomplicated labor course as follows: Membrane Rupture Time/Date: 8:50 AM ,10/04/2018   Intrapartum Procedures: Episiotomy: None [1]                                         Lacerations:  None [1]  Patient had delivery of a Viable infant.  Information for the patient's newborn:  Jill Montgomery [[id number]][[id number]]  Delivery Method: Vag-Spont    10/04/2018  Details of delivery can be found in separate delivery note. She then underwent a postpartum bilateral tubal ligation on 10/04/2018. On PPD/POD #1 she was voiding without difficulty, tolerating regular diet, was afebrile, and was ambulating without assist. Her pain was controlled on oral analgesics. She was deemed stable for discharge.  Patient is discharged home 10/05/18.                                                                 Post partum procedures:postpartum tubal ligation  Complications: None  Physical exam on 10/05/2018: Vitals:   10/04/18 2011 10/05/18 0008 10/05/18 0328 10/05/18 0722  BP: 120/65 118/63 (!) 107/58 114/67  Pulse: 67 60 (!) 58 64  Resp: 18 16 16 18   Temp: 98.8 F (37.1 C) 98.4 F (36.9 C) 98.7 F (37.1 C) 98.7 F (37.1 C)  TempSrc: Oral Oral Oral Oral  SpO2: 99% 100% 98% 99%  Weight:      Height:       General: alert, cooperative and no  distress Lochia: appropriate Uterine Fundus: not palpated due to incision Incision: Umbilical dressing C+D+I DVT Evaluation: No evidence of DVT seen on physical exam.  Labs: Lab Results  Component Value Date   WBC 6.5 10/03/2018   HGB 12.2 10/03/2018   HCT 37.7 10/03/2018   MCV 87.1 10/03/2018   PLT 146 (L) 10/03/2018   CMP Latest Ref Rng & Units 08/25/2017  Glucose 65 - 99 mg/dL 86  BUN 6 - 20 mg/dL 10  Creatinine 4.690.44 - 6.291.00 mg/dL 5.280.63  Sodium 413135 - 244145 mmol/L 140  Potassium 3.5 - 5.1 mmol/L 3.9  Chloride 101 - 111 mmol/L 108  CO2 22 - 32 mmol/L 26  Calcium 8.9 - 10.3 mg/dL 0.1(U8.7(L)    Discharge instruction: per After Visit Summary.  Medications:  Allergies as of 10/05/2018   No Known Allergies     Medication List    STOP taking these medications   Doxylamine-Pyridoxine 10-10 MG Tbec Commonly known as:  Diclegis   VITAMIN B-6 PO     TAKE these medications   ibuprofen 600 MG tablet Commonly known as: ADVIL Take 1 tablet (600 mg total) by mouth every 6 (six) hours as needed.   oxyCODONE-acetaminophen 5-325 MG tablet Commonly known as: PERCOCET/ROXICET Take 1 tablet by mouth every 6 (six) hours as needed for up to 3 days for severe pain.   PRENATAL VITAMIN PO Take by mouth.       Diet: routine diet  Activity: Advance as tolerated. Pelvic rest for 6 weeks.   Outpatient follow up: Follow-up Information    Rexene Agent, CNM. Schedule an appointment as soon as possible for a visit in 6 week(s).   Specialties: Certified Nurse Midwife, Radiology Contact information: [address] [place] [address] [phone]             Postpartum contraception: Tubal Ligation Rhogam Given postpartum: no Rubella vaccine given postpartum: no Varicella vaccine given postpartum: no TDaP given antepartum or postpartum: Yes, antepartum 07/26/2018  Newborn Data: Live born female  Birth Weight:  6#12.6oz APGAR: 10, 9  Newborn Delivery   Birth  date/time: 10/04/2018 10:00:00 Delivery type: Vaginal, Spontaneous       Baby Feeding: Breast and formula  Disposition:home with mother  SIGNED:  Dalia Heading, CNM 10/05/2018 10:32 AM

## 2018-10-04 NOTE — Anesthesia Postprocedure Evaluation (Signed)
Anesthesia Post Note  Patient: Jill Montgomery  Procedure(s) Performed: POST PARTUM TUBAL LIGATION (Bilateral )  Patient location during evaluation: PACU Anesthesia Type: General Level of consciousness: awake and alert Pain management: pain level controlled Vital Signs Assessment: post-procedure vital signs reviewed and stable Respiratory status: spontaneous breathing, nonlabored ventilation and respiratory function stable Cardiovascular status: blood pressure returned to baseline and stable Postop Assessment: no apparent nausea or vomiting Anesthetic complications: no     Last Vitals:  Vitals:   10/04/18 1752 10/04/18 1757  BP:  122/77  Pulse:  65  Resp:  18  Temp: 36.6 C   SpO2:  97%    Last Pain:  Vitals:   10/04/18 1752  TempSrc:   PainSc: 0-No pain                 Durenda Hurt

## 2018-10-04 NOTE — Op Note (Signed)
Operative Note  10/04/2018  PRE-OP DIAGNOSIS: Desire for sterility  POST-OP DIAGNOSIS: same  SURGEON: Barnett Applebaum, MD, FACOG  PROCEDURE: Postpartum Bilateral Tubal Ligation Procedure   ANESTHESIA: General   ESTIMATED BLOOD LOSS: Min  SPECIMENS: Portion of left and right tube  COMPLICATIONS: None  DISPOSITION: PACU - hemodynamically stable.  CONDITION: stable  FINDINGS: Exam under anesthesia revealed small, mobile  uterus with no masses and bilateral adnexa without masses or fullness.   TECHNIQUE:  Patient is prepped and draped in usual sterile fashion after adequate anesthesia is obtained in the supine position on the operating room table.  Local anesthesia is injected into the skin just inferior to the umbilicus, followed by a small elliptical incision with a scapel.  Fascia is identified and tented upwards, and an incision is made with Mayo scissors.  Identification of no adherent bowel is made. Retractors are placed and trendelenburg positioning is achieved.    The left Fallopian tube was identified, grasped with the Babcock clamps, lifted to the skin incision and followed out distally to the fimbriae. An avascular midsection of the tube approximately 3-4cm from the cornua was grasped with the babcock clamps and brought into a knuckle at the skin incision. The tube was double ligated with 2-0 Vicryl suture and the intervening portion of tube was transected and removed. Excellent hemostasis was noted and the tube was returned to the abdomen. Attention was then turned to the right fallopian tube after confirmation of identification by tracing the tube out to the fimbriae. The same procedure was then performed on the right Fallopian tube. Again, excellent hemostasis was noted at the end of the procedure.  Retractors are removed and fascia closed with a 2-0 Vicryl suture. Irrigation and hemostasis confirmed.  Skin closed with a 4-0 vicryl suture in a subcuticular fashion followed by skin  adhesive.  Pt goes to recovery room in stable fashion.  All counts correct times 2.   Barnett Applebaum, MD, [facility], [facility] 10/04/2018  5:10 PM

## 2018-10-04 NOTE — Progress Notes (Signed)
Little change in cervical exam per RN overnight. Patient to have breakfast and get up to shower before resuming induction. Plan to restart with pitocin.  Rod Can, CNM

## 2018-10-04 NOTE — Anesthesia Preprocedure Evaluation (Signed)
Anesthesia Evaluation  Patient identified by MRN, date of birth, ID band Patient awake    Reviewed: Allergy & Precautions, H&P , NPO status , [name] Chart, lab work & pertinent test results  Airway Mallampati: III  TM Distance: >3 FB Neck ROM: full    Dental  (+) Partial Upper   Pulmonary neg pulmonary ROS, neg shortness of breath,           Cardiovascular (-) hypertensionnegative cardio ROS  (-) dysrhythmias      Neuro/Psych negative neurological ROS  negative psych ROS   GI/Hepatic negative GI ROS, Neg liver ROS,   Endo/Other  negative endocrine ROS  Renal/GU      Musculoskeletal   Abdominal   Peds  Hematology negative hematology ROS (+)   Anesthesia Other Findings Past Medical History: No date: Ovarian cyst  Past Surgical History: 09/01/2017: CHROMOPERTUBATION; N/A     Comment:  Procedure: CHROMOPERTUBATION;  Surgeon: Benjaman Kindler, MD;  Location: [facility] ORS;  Service: Gynecology;                Laterality: N/A; 09/01/2017: LAPAROSCOPIC OVARIAN CYSTECTOMY; Left     Comment:  Procedure: LAPAROSCOPIC OVARIAN CYSTECTOMY;  Surgeon:               Benjaman Kindler, MD;  Location: [facility] ORS;  Service:               Gynecology;  Laterality: Left; No date: NO PAST SURGERIES  BMI    Body Mass Index: 28.34 kg/m      Reproductive/Obstetrics Delivered this morning                             Anesthesia Physical Anesthesia Plan  ASA: II  Anesthesia Plan: General ETT   Post-op Pain Management:    Induction:   PONV Risk Score and Plan: Ondansetron, Dexamethasone, Midazolam and Treatment may vary due to age or medical condition  Airway Management Planned:   Additional Equipment:   Intra-op Plan:   Post-operative Plan:   Informed Consent: I have reviewed the patients History and Physical, chart, labs and discussed the procedure including the risks, benefits and  alternatives for the proposed anesthesia with the patient or authorized representative who has indicated his/her understanding and acceptance.     Dental Advisory Given  Plan Discussed with: Anesthesiologist, CRNA and Surgeon  Anesthesia Plan Comments:         Anesthesia Quick Evaluation

## 2018-10-04 NOTE — Interval H&P Note (Signed)
History and Physical Interval Note:  10/04/2018 3:09 PM  Jill Montgomery  has presented today for surgery, with the diagnosis of desire for permanent sterilitty.  The various methods of treatment have been discussed with the patient and family. After consideration of risks, benefits and other options for treatment, the patient has consented to  Procedure(s): POST PARTUM TUBAL LIGATION (Bilateral) as a surgical intervention.  The patient's history has been reviewed, patient examined, no change in status, stable for surgery.  I have reviewed the patient's chart and labs.  Questions were answered to the patient's satisfaction.     Hoyt Koch

## 2018-10-04 NOTE — Progress Notes (Signed)
PRE-OPERATIVE CONSULTATION  HPI:  Jill Montgomery is a 28 y.o. G3P3003 [name] last menstrual period was 12/20/2017 (exact date).; she is being admitted for surgery related to requested sterilization.  She just delivered today, third baby, does not desire any more pregnancies.  She has been counseled as to having permanent sterilization throughout pregnancy, and continues to desire this for contraception.  Interpret used to obtain consent and ensures this is her wish.  PMHx: Past Medical History:  Diagnosis Date  . Ovarian cyst    Past Surgical History:  Procedure Laterality Date  . CHROMOPERTUBATION N/A 09/01/2017   Procedure: CHROMOPERTUBATION;  Surgeon: Benjaman Kindler, MD;  Location: [facility] ORS;  Service: Gynecology;  Laterality: N/A;  . LAPAROSCOPIC OVARIAN CYSTECTOMY Left 09/01/2017   Procedure: LAPAROSCOPIC OVARIAN CYSTECTOMY;  Surgeon: Benjaman Kindler, MD;  Location: [facility] ORS;  Service: Gynecology;  Laterality: Left;  . NO PAST SURGERIES     History reviewed. No pertinent family history. Social History   Tobacco Use  . Smoking status: Never Smoker  . Smokeless tobacco: Never Used  Substance Use Topics  . Alcohol use: Not Currently  . Drug use: Never    Current Facility-Administered Medications:  .  acetaminophen (TYLENOL) tablet 650 mg, 650 mg, Oral, Q4H PRN, Rexene Agent, CNM .  benzocaine-Menthol (DERMOPLAST) 20-0.5 % topical spray 1 application, 1 application, Topical, PRN, Rexene Agent, CNM .  coconut oil, 1 application, Topical, PRN, Rexene Agent, CNM .  witch hazel-glycerin (TUCKS) pad 1 application, 1 application, Topical, PRN **AND** dibucaine (NUPERCAINAL) 1 % rectal ointment 1 application, 1 application, Rectal, PRN, Rexene Agent, CNM .  diphenhydrAMINE (BENADRYL) capsule 25 mg, 25 mg, Oral, Q6H PRN, Rexene Agent, CNM .  ibuprofen (ADVIL) tablet 600 mg, 600 mg, Oral, Q6H, Rexene Agent, CNM, Stopped at 10/04/18 1315 .  lactated ringers  infusion, , Intravenous, Continuous, Rexene Agent, CNM .  ondansetron (ZOFRAN) tablet 4 mg, 4 mg, Oral, Q4H PRN **OR** ondansetron (ZOFRAN) injection 4 mg, 4 mg, Intravenous, Q4H PRN, Rexene Agent, CNM .  oxyCODONE-acetaminophen (PERCOCET/ROXICET) 5-325 MG per tablet 1 tablet, 1 tablet, Oral, Q4H PRN, Rexene Agent, CNM .  oxyCODONE-acetaminophen (PERCOCET/ROXICET) 5-325 MG per tablet 2 tablet, 2 tablet, Oral, Q4H PRN, Rexene Agent, CNM .  prenatal multivitamin tablet 1 tablet, 1 tablet, Oral, Q1200, Rexene Agent, CNM .  senna-docusate (Senokot-S) tablet 2 tablet, 2 tablet, Oral, Q24H, Shanlever, Pierce Crane, RPH .  simethicone (MYLICON) chewable tablet 80 mg, 80 mg, Oral, PRN, Rexene Agent, CNM Allergies: Patient has no known allergies.  Review of Systems  All other systems reviewed and are negative.   Objective: BP 123/74 (BP Location: Left Arm)   Pulse 62   Temp 98.9 F (37.2 C) (Oral)   Resp 18   Ht 5\' 3"  (1.6 m)   Wt 72.6 kg   LMP 12/20/2017 (Exact Date)   SpO2 99% Comment: Room Air.  Breastfeeding Unknown   BMI 28.34 kg/m   Filed Weights   10/03/18 0831  Weight: 72.6 kg   Physical Exam Constitutional:      General: She is not in acute distress.    Appearance: She is well-developed.  HENT:     Head: Normocephalic and atraumatic. No laceration.     Right Ear: Hearing normal.     Left Ear: Hearing normal.     Mouth/Throat:     Pharynx: Uvula midline.  Eyes:     Pupils: Pupils  are equal, round, and reactive to light.  Neck:     Musculoskeletal: Normal range of motion and neck supple.     Thyroid: No thyromegaly.  Cardiovascular:     Rate and Rhythm: Normal rate and regular rhythm.     Heart sounds: No murmur. No friction rub. No gallop.   Pulmonary:     Effort: Pulmonary effort is normal. No respiratory distress.     Breath sounds: Normal breath sounds. No wheezing.  Abdominal:     General: Bowel sounds are normal. There is no  distension.     Palpations: Abdomen is soft.     Tenderness: There is no abdominal tenderness. There is no rebound.  Musculoskeletal: Normal range of motion.  Neurological:     Mental Status: She is alert and oriented to person, place, and time.     Cranial Nerves: No cranial nerve deficit.  Skin:    General: Skin is warm and dry.  Psychiatric:        Judgment: Judgment normal.  Vitals signs reviewed.    Assessment:Desire for sterility post partum  The patient has been fully informed about all methods of contraception, both temporary and permanent. She understands that tubal ligation is meant to be permanent, absolute and irreversible. She was told that there is an approximately 1 in 400 chance of a pregnancy in the future after tubal ligation. She was told the short and long term complications of tubal ligation. She understands the risks from this surgery include, but are not limited to, the risks of anesthesia, hemorrhage, infection, perforation, and injury to adjacent structures, bowel, bladder and blood vessels.    Paul Vashti Bolanos, MD, FACOG [facility], [facility] 10/04/2018  3:06 PM  

## 2018-10-04 NOTE — Progress Notes (Signed)
Report given to Kendra, RN

## 2018-10-04 NOTE — Anesthesia Procedure Notes (Signed)
Procedure Name: Intubation Performed by: Jehan Ranganathan, CRNA Pre-anesthesia Checklist: Patient identified, Patient being monitored, Timeout performed, Emergency Drugs available and Suction available Patient Re-evaluated:Patient Re-evaluated prior to induction Oxygen Delivery Method: Circle system utilized Preoxygenation: Pre-oxygenation with 100% oxygen Induction Type: IV induction and Rapid sequence Laryngoscope Size: 3 and McGraph Grade View: Grade I Tube type: Oral Tube size: 7.0 mm Number of attempts: 1 Airway Equipment and Method: Stylet and Video-laryngoscopy Placement Confirmation: ETT inserted through vocal cords under direct vision,  positive ETCO2 and breath sounds checked- equal and bilateral Secured at: 21 cm Tube secured with: Tape Dental Injury: Teeth and Oropharynx as per pre-operative assessment        

## 2018-10-04 NOTE — Anesthesia Post-op Follow-up Note (Signed)
Anesthesia QCDR form completed.        

## 2018-10-05 ENCOUNTER — Ambulatory Visit: Payer: Self-pay

## 2018-10-05 ENCOUNTER — Encounter: Payer: Self-pay | Admitting: Obstetrics & Gynecology

## 2018-10-05 MED ORDER — IBUPROFEN 600 MG PO TABS: 600.0000 mg | ORAL_TABLET | Freq: Four times a day (QID) | ORAL | 1 refills | Status: DC | PRN

## 2018-10-05 MED ORDER — OXYCODONE-ACETAMINOPHEN 5-325 MG PO TABS: 1.0000 | ORAL_TABLET | Freq: Four times a day (QID) | ORAL | 0 refills | Status: AC | PRN

## 2018-10-05 NOTE — Lactation Note (Signed)
This note was copied from a baby's chart. Lactation Consultation Note  Patient Name: Girl Arieon Scalzo [id number] Date: 10/05/2018 Reason for consult: Initial assessment;Other (Comment)(FOB giving bottles after most breast feeds)  When when went in to mom's room, her baby was demonstrating feeding cues.  Explained these were hunger cues and she needed to put her to the breast.  Father of baby reports just feeding a bottle of formula an hour before.  Explained why babies cluster feed.  Demonstrated hand expression of colostrum showing she had what the baby needed and encouraged to rub on nipples after feeding.  Assisted mom in comfortable position with pillow support in cradle hold on right breast.  She latched with minimal assistance and began strong rhythmic sucking with occasional swallows.  Mom kept pulling back on breast to allow breathing room causing shallow latch and slight discomfort.   Shown how to sandwich breast compressing just enough to allow plenty of breathing room and deeper latch.  Coconut oil given with insructions in use.    Shortly into breast feed, she started falling asleep.  FOB says she keeps falling asleep at the breast.  Explained how she might be used to fast flow bottle now and not willing to continue to work at the breast because she knows she will be filled up with formula after she breast feeds.  Demonstrated how to massage breast to keep her actively sucking and swallowing at the breast.  Explained supply and demand and need to breast feed frequently to bring in mature milk, ensure a plentiful milk supply and prevent engorgement.  FOB said she would have to go back to work soon and he would need to give formula then.  Discussed how to get DEBP through insurance for when she had to return to work.  Reviewed stomach size, normal course of lactation and routine newborn feeding patterns.  Mom and baby to be discharged today.  Lactation community resources available after discharge  discussed and contact numbers given.  Lactation name and number written on white board and encouraged to call with any questions, concerns or assistance. Maternal Data Formula Feeding for Exclusion: No Has patient been taught Hand Expression?: Yes Does the patient have breastfeeding experience prior to this delivery?: Yes  Feeding Feeding Type: Breast Fed Nipple Type: Slow - flow  LATCH Score Latch: Grasps breast easily, tongue down, lips flanged, rhythmical sucking.  Audible Swallowing: A few with stimulation  Type of Nipple: Everted at rest and after stimulation  Comfort (Breast/Nipple): Filling, red/small blisters or bruises, mild/mod discomfort  Hold (Positioning): Assistance needed to correctly position infant at breast and maintain latch.  LATCH Score: 7  Interventions Interventions: Breast feeding basics reviewed;Assisted with latch;Breast massage;Hand express;Reverse pressure;Breast compression;Adjust position;Support pillows;Position options;Coconut oil  Lactation Tools Discussed/Used Tools: Coconut oil WIC Program: No(BCBS Insurance) Pump Review: Setup, frequency, and cleaning;Milk Storage;Other (comment) Initiated by:: (Information given)   Consult Status Consult Status: PRN    Jarold Motto 10/05/2018, 3:01 PM

## 2018-10-05 NOTE — Progress Notes (Signed)
Post Partum Day 1/ POD #1 ppBTL. Interviewed with interpretor services Subjective: voiding, tolerating PO and some incisional pain relieved with analgesics.  Objective: Blood pressure 114/67, pulse 64, temperature 98.7 F (37.1 C), temperature source Oral, resp. rate 18, height 5\' 3"  (1.6 m), weight 72.6 kg, last menstrual period 12/20/2017, SpO2 99 %, unknown if currently breastfeeding.  Physical Exam:  General: alert, cooperative and no distress, breast feeding baby Lochia: appropriate Uterine Fundus: not palpated due to incision Umbilical Incision: dressing C+D+I DVT Evaluation: No evidence of DVT seen on physical exam.  Recent Labs    10/03/18 0840  HGB 12.2  HCT 37.7  WBC 6.5  PLT 146*    Assessment/Plan: Stable PPD #1; POD #1 Desires discharge Discharge home with instructions.   LOS: 2 days   Dalia Heading 10/05/2018, 10:21 AM

## 2018-10-05 NOTE — TOC Progression Note (Signed)
Transition of Care [facility]) - Progression Note    Patient Details  Name: Jill Montgomery MRN: [id number] Date of Birth: [birth year]  [facility] CM/SW Contact  Skylan Lara, Lenice Llamas Phone Number: [phone]  10/05/2018, 3:21 PM  Clinical Narrative: Clinical Social Worker ([place]) received consult due to patient's score on the depression screen. CSW attempted to meet with patient however she had already discharged home. Per RN patient is spanish speaking and an interpreter had to be utilized. Per RN patient scored a 10 on the depression screen (scoring over a 9 requires a social work consult). CSW attempted to contact patient via telephone and utilized a spanish interpreter via language line. Patient did not answer and a voicemail could not be left. CSW also attempted to contact patient's emergency contact Bill however he did not answer and a voicemail was left.            Expected Discharge Plan and Services           Expected Discharge Date: 10/05/18                                     Social Determinants of Health (SDOH) Interventions    Readmission Risk Interventions No flowsheet data found.

## 2018-10-05 NOTE — Progress Notes (Signed)
Patient discharged home with infant. Discharge instructions and prescriptions given and reviewed with patient. Patient verbalized understanding. Escorted out by staff.  

## 2018-10-05 NOTE — Discharge Instructions (Signed)
Parto vaginal, cuidados de puerperio Postpartum Care After Vaginal Delivery Lea esta informacin sobre cmo cuidarse desde el momento en que nazca su beb y hasta 6 a 12 semanas despus del parto (perodo del posparto). El mdico tambin podr darle instrucciones ms especficas. Comunquese con su mdico si tiene problemas o preguntas. Siga estas indicaciones en su casa: Hemorragia vaginal  Es normal tener un poco de hemorragia vaginal (loquios) despus del parto. Use un apsito sanitario para el sangrado vaginal y secrecin. ? Durante la primera semana despus del parto, la cantidad y el aspecto de los loquios a menudo es similar a las del perodo menstrual. ? Durante las siguientes semanas disminuir gradualmente hasta convertirse en una secrecin seca amarronada o amarillenta. ? En la mayora de las mujeres, los loquios se detienen completamente entre 4 a 6semanas despus del parto. Los sangrados vaginales pueden variar de mujer a mujer.  Cambie los apsitos sanitarios con frecuencia. Observe si hay cambios en el flujo, como: ? Un aumento repentino en el volumen. ? Cambio en el color. ? Cogulos sanguneos grandes.  Si expulsa un cogulo de sangre por la vagina, gurdelo y llame al mdico para informrselo. No deseche los cogulos de sangre por el inodoro antes de hablar con su mdico.  No use tampones ni se haga duchas vaginales hasta que el mdico la autorice.  Si no est amamantando, volver a tener su perodo entre 6 y 8 semanas despus del parto. Si solamente alimenta al beb con leche materna (lactancia materna exclusiva), podra no volver a tener su perodo hasta que deje de amamantar. Cuidados perineales  Mantenga la zona entre la vagina y el ano (perineo) limpia y seca, como se lo haya indicado el mdico. Utilice apsitos o aerosoles analgsicos y cremas, como se lo hayan indicado.  Si le hicieron un corte en el perineo (episiotoma) o tuvo un desgarro en la vagina, controle la  zona para detectar signos de infeccin hasta que sane. Est atenta a los siguientes signos: ? Aumento del enrojecimiento, la hinchazn o el dolor. ? Presenta lquido o sangre que supura del corte o desgarro. ? Calor. ? Pus o mal olor.  Es posible que le den una botella rociadora para que use en lugar de limpiarse el rea con papel higinico despus de usar el bao. Cuando comience a sanar, podr usar la botella rociadora antes de secarse. Asegrese de secarse suavemente.  Para aliviar el dolor causado por una episiotoma, un desgarro en la vagina o venas hinchadas en el ano (hemorroides), trate de tomar un bao de asiento tibio 2 o 3 veces por da. Un bao de asiento es un bao de agua tibia que se toma mientras se est sentado. El agua solo debe llegar hasta las caderas y cubrir las nalgas. Cuidado de las mamas  En los primeros das despus del parto, las mamas pueden sentirse pesadas, llenas e incmodas (congestin mamaria). Tambin puede escaparse leche de sus senos. El mdico puede sugerirle mtodos para aliviar este malestar. La congestin mamaria debera desaparecer al cabo de unos das.  Si est amamantando: ? Use un sostn que sujete y ajuste bien sus pechos. ? Mantenga los pezones secos y limpios. Aplquese cremas y ungentos, como se lo haya indicado el mdico. ? Es posible que deba usar discos de algodn en el sostn para absorber la leche que se filtre de sus senos. ? Puede tener contracciones uterinas cada vez que amamante durante varias semanas despus del parto. Las contracciones uterinas ayudan al tero a   regresar a su tamao habitual. ? Si tiene algn problema con la lactancia materna, colabore con el mdico o un asesor en lactancia.  Si no est amamantando: ? Evite tocarse mucho las mamas. Al hacerlo, podran producir ms leche. ? Use un sostn que le proporcione el ajuste correcto y compresas fras para reducir la hinchazn. ? No extraiga (saque) leche materna. Esto har que  produzca ms leche. Intimidad y sexualidad  Pregntele al mdico cundo puede retomar la actividad sexual. Esto puede depender de lo siguiente: ? Su riesgo de sufrir infecciones. ? La rapidez con la que est sanando. ? Su comodidad y deseo de retomar la actividad sexual.  Despus del parto, puede quedar embarazada incluso si no ha tenido todava su perodo. Si lo desea, hable con el mdico acerca de los mtodos de control de la natalidad (mtodos anticonceptivos). Medicamentos  Tome los medicamentos de venta libre y los recetados solamente como se lo haya indicado el mdico.  Si le recetaron un antibitico, tmelo como se lo haya indicado el mdico. No deje de tomar el antibitico aunque comience a sentirse mejor. Actividad  Retome sus actividades normales de a poco como se lo haya indicado el mdico. Pregntele al mdico qu actividades son seguras para usted.  Descanse todo lo que pueda. Trate de descansar o tomar una siesta mientras el beb duerme. Comida y bebida   Beba suficiente lquido como para mantener la orina de color amarillo plido.  Coma alimentos ricos en fibras todos los das. Estos pueden ayudarla a prevenir o aliviar el estreimiento. Los alimentos ricos en fibras incluyen, entre otros: ? Panes y cereales integrales. ? Arroz integral. ? Frijoles. ? Frutas y verduras frescas.  No intente perder de peso rpidamente reduciendo el consumo de caloras.  Tome sus vitaminas prenatales hasta la visita de seguimiento de posparto o hasta que su mdico le indique que puede dejar de tomarlas. Estilo de vida  No consuma ningn producto que contenga nicotina o tabaco, como cigarrillos y cigarrillos electrnicos. Si necesita ayuda para dejar de fumar, consulte al mdico.  No beba alcohol, especialmente si est amamantando. Instrucciones generales  Concurra a todas las visitas de seguimiento para usted y el beb, como se lo haya indicado el mdico. La mayora de las mujeres  visita al mdico para un seguimiento de posparto dentro de las primeras 3 a 6 semanas despus del parto. Comunquese con un mdico si:  Se siente incapaz de controlar los cambios que implica tener un hijo y esos sentimientos no desaparecen.  Siente tristeza o preocupacin de forma inusual.  Las mamas se ponen rojas, le duelen o se endurecen.  Tiene fiebre.  Tiene dificultad para retener la orina o para impedir que la orina se escape.  Tiene poco inters o falta de inters en actividades que solan gustarle.  No ha amamantado nada y no ha tenido un perodo menstrual durante 12 semanas despus del parto.  Dej de amamantar al beb y no ha tenido su perodo menstrual durante 12 semanas despus de dejar de amamantar.  Tiene preguntas sobre su cuidado y el del beb.  Elimina un cogulo de sangre grande por la vagina. Solicite ayuda de inmediato si:  Siente dolor en el pecho.  Tiene dificultad para respirar.  Tiene un dolor repentino e intenso en la pierna.  Tiene dolor intenso o clicos en el la parte inferior del abdomen.  Tiene una hemorragia tan intensa de la vagina que empapa ms de un apsito en una hora. El sangrado   no debe ser ms abundante que el perodo ms intenso que haya tenido.  Dolor de cabeza intenso.  Se desmaya.  Tiene visin borrosa o Nurse, adultmanchas en la vista.  Tiene secrecin vaginal con mal olor.  Tiene pensamientos acerca de lastimarse a usted misma o a su beb. Si alguna vez siente que puede lastimarse a usted misma o a Economistotras personas, o tiene pensamientos de poner fin a su vida, busque ayuda de inmediato. Puede dirigirse al departamento de emergencias ms cercano o llamar a:  El servicio de emergencias de su localidad (911 en [place]  Una lnea de asistencia al suicida y Visual merchandiseratencin en crisis, como la Murphy OilLnea Nacional de Prevencin del Suicidio (National Suicide Prevention Lifeline), al [phone]. Est disponible las 24 horas del da. Resumen  El  perodo de tiempo justo despus el parto y [place] 6 a 12 semanas despus del parto se denomina perodo posparto.  Retome sus actividades normales de a Naval architectpoco como se lo haya indicado el mdico.  Concurra a todas las visitas de seguimiento para usted y Dance movement psychotherapistel beb, como se lo haya indicado el mdico. Esta informacin no tiene Theme park managercomo fin reemplazar el consejo del mdico. Asegrese de hacerle al mdico cualquier pregunta que tenga. Document Released: 01/09/2007 Document Revised: 06/24/2017 Document Reviewed: 03/05/2017 Elsevier Patient Education  2020 [employer]   ManillaLigadura de trompas posparto, cuidados posteriores Postpartum Tubal Ligation, Care After Levi StraussEsta hoja le brinda informacin sobre cmo cuidarse despus del procedimiento. El mdico tambin podr darle instrucciones ms especficas. Comunquese con el mdico si tiene problemas o preguntas. Qu puedo esperar despus del procedimiento? Despus del procedimiento, puede tener lo siguiente:  Dolor de Advertising copywritergarganta.  Moretones o Radiographer, therapeuticdolor en la espalda.  Nuseas o vmitos.  Mareos.  Dolor o molestias abdominales leves, como clicos, dolor por gases o sensacin de hinchazn.  Dolor alrededor del rea de la incisin.  Cansancio.  Dolor en los hombros. Siga estas instrucciones en su casa: Medicamentos  Pregntele al mdico si el medicamento recetado: ? Hace que sea necesario que evite conducir o usar maquinaria pesada. ? Puede causarle estreimiento. Es posible que deba tomar medidas para prevenir o tratar el estreimiento, por ejemplo:  Beber suficiente lquido como para Pharmacologistmantener la orina de color amarillo plido.  Tomar medicamentos recetados o de H. J. Heinzventa libre.  Consumir alimentos ricos en fibra, como frijoles, cereales integrales, y frutas y verduras frescas.  Limitar el consumo de alimentos ricos en grasa y azcares procesados, como los alimentos fritos o dulces.  No tome aspirina, ya que puede causar hemorragias. Actividad  Haga  reposo por el resto del da.  Retome sus actividades normales segn lo indicado por el mdico. Pregntele al mdico qu actividades son seguras para usted.  No mantenga relaciones sexuales, no se haga duchas vaginales ni se coloque un tampn o cualquier otro objeto en la vagina durante 6semanas o el tiempo que le haya indicado el mdico.  No levante ningn objeto que sea ms pesado que su beb, o el lmite de peso que le hayan indicado, durante 2semanas o hasta que el mdico le diga que puede [place]. Cuidado de la incisin      Siga las instrucciones del mdico acerca del cuidado de la incisin. Asegrese de hacer lo siguiente: ? Lvese las manos con agua y [place] antes y despus de cambiar la venda (vendaje). Use desinfectante para manos si no dispone de [place] y [place]jabn. ? Cambie el vendaje como se lo haya indicado el mdico. ? No retire los puntos (suturas),  la goma para cerrar la piel o las tiras [place]. Es posible que estos cierres cutneos deban quedar puestos en la piel durante 2semanas o ms tiempo. Si los bordes de las tiras adhesivas empiezan a despegarse y Therapist, sports, puede recortar los que estn sueltos. No retire las tiras [facility] por completo a menos que el mdico se lo indique.  [place] zona de la incisin para detectar signos de infeccin. Est atento a los siguientes signos: ? Enrojecimiento, hinchazn o dolor. ? Lquido o sangre. ? Calor. ? Pus o mal olor. Otras instrucciones  No tome baos de inmersin, no nade ni use el jacuzzi hasta que el mdico lo autorice. Pregntele al mdico si puede ducharse. Thurston Pounds solo le permitan darse baos de [place].  Concurra a todas las visitas de seguimiento como se lo haya indicado el mdico. Esto es importante. Comunquese con un mdico si:  Tiene enrojecimiento, hinchazn o dolor alrededor de la incisin.  Su incisin se siente caliente al tacto.  El dolor no mejora despus de 2a 3das.  Tiene una  erupcin cutnea.  Tiene mareos o sensacin de desvanecimiento de forma repetida.  El medicamento no Production designer, theatre/television/film.  Tiene estreimiento. Solicite ayuda inmediatamente si:  Tiene fiebre.  Se desmaya.  Tiene dolor abdominal que empeora.  Presenta lquido o sangre proveniente de la incisin.  Tiene pus o percibe que sale mal olor de su incisin.  Se le abren los bordes de la incisin despus de que le hayan sacado las suturas.  Le falta el aire o tiene dificultad para respirar.  Siente dolor en el pecho o en las piernas.  Tiene nuseas o diarrea de forma continua. Resumen  Es comn sentir molestias abdominales leves despus de este procedimiento.  Comunquese con su mdico si presenta problemas o tiene alguna preocupacin.  No levante ningn objeto que sea ms pesado que su beb, o el lmite de peso que le hayan indicado, durante 2semanas o hasta que el mdico le diga que puede [place].  Concurra a todas las visitas de seguimiento como se lo haya indicado el mdico. Esto es importante. Esta informacin no tiene Marine scientist el consejo del mdico. Asegrese de hacerle al mdico cualquier pregunta que tenga. Document Released: 09/13/2011 Document Revised: 04/02/2018 Document Reviewed: 04/02/2018 Elsevier Patient Education  [place].

## 2018-10-09 LAB — SURGICAL PATHOLOGY

## 2018-11-19 ENCOUNTER — Other Ambulatory Visit: Payer: Self-pay

## 2018-11-19 ENCOUNTER — Ambulatory Visit (INDEPENDENT_AMBULATORY_CARE_PROVIDER_SITE_OTHER): Payer: BC Managed Care – PPO | Admitting: Obstetrics & Gynecology

## 2018-11-19 ENCOUNTER — Encounter: Payer: Self-pay | Admitting: Obstetrics & Gynecology

## 2018-11-19 DIAGNOSIS — Z1389 Encounter for screening for other disorder: Secondary | ICD-10-CM

## 2018-11-19 NOTE — Progress Notes (Signed)
  OBSTETRICS POSTPARTUM CLINIC PROGRESS NOTE  Subjective:     Jill Montgomery is a 28 y.o. [id number] female who presents for a postpartum visit. She is 6 weeks postpartum following a Term pregnancy and delivery by Vaginal, no problems at delivery. Also had a PP BTL.  I have fully reviewed the prenatal and intrapartum course. Anesthesia: IV sedation.  Postpartum course has been complicated by uncomplicated.  Baby is feeding by Bottle and Breast.  Bleeding: patient has  resumed menses.  Bowel function is normal. Bladder function is normal.  Patient is not sexually active. Contraception method desired is tubal ligation.  Postpartum depression screening: negative. Edinburgh 3.  The following portions of the patient's history were reviewed and updated as appropriate: allergies, current medications, past family history, past medical history, past social history, past surgical history and problem list.  Review of Systems Pertinent items are noted in HPI.  Objective:    BP 120/80   Ht 5\' 3"  (1.6 m)   Wt 148 lb (67.1 kg)   BMI 26.22 kg/m   General:  alert and no distress   Breasts:  inspection negative, no nipple discharge or bleeding, no masses or nodularity palpable  Lungs: clear to auscultation bilaterally  Heart:  regular rate and rhythm, S1, S2 normal, no murmur, click, rub or gallop  Abdomen: soft, non-tender; bowel sounds normal; no masses,  no organomegaly.  Well healed tubal incision   Vulva:  normal  Vagina: normal vagina, no discharge, exudate, lesion, or erythema  Cervix:  no cervical motion tenderness and no lesions  Corpus: normal size, contour, position, consistency, mobility, non-tender  Adnexa:  normal adnexa and no mass, fullness, tenderness  Rectal Exam: Not performed.          Assessment:  Post Partum Care visit 1. Postpartum care following vaginal delivery  Plan:  See orders and Patient Instructions Resume all normal activities PAP in Dec due RTW note  Follow up  in: 4 months or as needed.   Barnett Applebaum, MD, [facility], [facility] 11/19/2018  9:36 AM

## 2019-02-11 ENCOUNTER — Other Ambulatory Visit: Payer: Self-pay

## 2019-02-11 ENCOUNTER — Other Ambulatory Visit (HOSPITAL_COMMUNITY)
Admission: RE | Admit: 2019-02-11 | Discharge: 2019-02-11 | Disposition: A | Discharge status: Home / Self Care | Payer: BC Managed Care – PPO | Source: Ambulatory Visit | Attending: Obstetrics & Gynecology | Admitting: Obstetrics & Gynecology

## 2019-02-11 ENCOUNTER — Encounter: Payer: Self-pay | Admitting: Obstetrics & Gynecology

## 2019-02-11 ENCOUNTER — Ambulatory Visit (INDEPENDENT_AMBULATORY_CARE_PROVIDER_SITE_OTHER): Payer: BC Managed Care – PPO | Admitting: Obstetrics & Gynecology

## 2019-02-11 VITALS — BP 120/80 | Ht 60.0 in | Wt 148.0 lb

## 2019-02-11 DIAGNOSIS — N760 Acute vaginitis: Secondary | ICD-10-CM

## 2019-02-11 DIAGNOSIS — B9689 Other specified bacterial agents as the cause of diseases classified elsewhere: Secondary | ICD-10-CM

## 2019-02-11 MED ORDER — METRONIDAZOLE 0.75 % VA GEL: 1.0000 | Freq: Every day | VAGINAL | 0 refills | Status: AC

## 2019-02-11 NOTE — Progress Notes (Signed)
  HPI:      [name] is a 28 y.o. [phone] who LMP was [name] last menstrual period was 02/04/2019., presents today for a problem visit.  She complains of:  Vaginitis: Patient complains of an abnormal vaginal discharge for 5 weeks. Vaginal symptoms include burning, local irritation and odor.Vulvar symptoms include none.STI Risk: Very low risk of STD exposureDischarge described as: white and thin.Other associated symptoms: none.Menstrual pattern: She had been bleeding recent first period since delivery 4 mos ago; breast and bottle feeding.  S/p BTL, kn no hormones. Contraception: tubal ligation  PMHx: She  has a past medical history of Ovarian cyst. Also,  has a past surgical history that includes No past surgeries; Laparoscopic ovarian cystectomy (Left, 09/01/2017); Chromopertubation (N/A, 09/01/2017); and Tubal ligation (Bilateral, 10/04/2018)., family history is not on file.,  reports that she has never smoked. She has never used smokeless tobacco. She reports previous alcohol use. She reports that she does not use drugs.  She has a current medication list which includes the following prescription(s): metronidazole and prenatal vit-fe fumarate-fa. Also, has No Known Allergies.  Review of Systems  All other systems reviewed and are negative.   Objective: BP 120/80   Ht 5' (1.524 m)   Wt 148 lb (67.1 kg)   LMP 02/04/2019   BMI 28.90 kg/m  Physical Exam Constitutional:      General: She is not in acute distress.    Appearance: She is well-developed.  Genitourinary:     Pelvic exam was performed with patient supine.     Vagina and uterus normal.     No vaginal erythema or bleeding.     No cervical motion tenderness, discharge, polyp or nabothian cyst.     Uterus is mobile.     Uterus is not enlarged.     No uterine mass detected.    Uterus is midaxial.     No right or left adnexal mass present.     Right adnexa not tender.     Left adnexa not tender.  HENT:     Head:  Normocephalic and atraumatic.     Nose: Nose normal.  Abdominal:     General: There is no distension.     Palpations: Abdomen is soft.     Tenderness: There is no abdominal tenderness.  Musculoskeletal: Normal range of motion.  Neurological:     Mental Status: She is alert and oriented to person, place, and time.     Cranial Nerves: No cranial nerve deficit.  Skin:    General: Skin is warm and dry.  Psychiatric:        Attention and Perception: Attention normal.        Mood and Affect: Mood and affect normal.        Speech: Speech normal.        Behavior: Behavior normal.        Thought Content: Thought content normal.        Judgment: Judgment normal.   WET PREP:   positive clue cells Findings are consistent with bacterial vaginosis.  ASSESSMENT/PLAN:    Problem List Items Addressed This Visit    BV (bacterial vaginosis)    -  Primary   Relevant Medications   metroNIDAZOLE (METROGEL) 0.75 % vaginal gel   Other Relevant Orders   Cervicovaginal ancillary only      Barnett Applebaum, MD, [facility], [facility] 02/11/2019  11:48 AM

## 2019-02-11 NOTE — Patient Instructions (Signed)
Vaginitis Vaginitis La vaginitis es la inflamacin de la vagina. Suele ser causada por una alteracin en el equilibrio normal de las bacterias y los hongos que viven en la vagina. Esta alteracin provoca el crecimiento excesivo de ciertos hongos o bacterias, lo que causa la inflamacin. Hay distintos tipos de vaginitis, pero los ms frecuentes son los siguientes:  Vaginosis bacteriana.  Infeccin por hongos (candidiasis).  Tricomoniasis. Esta es una infeccin de transmisin sexual (ITS).  Vaginitis viral.  Vaginitis atrfica.  Vaginitis alrgica. Cules son las causas? La causa depende del tipo de vaginitis. Las causas de la vaginitis pueden ser las siguientes:  Bacterias (vaginosis bacteriana).  Hongos (infeccin por hongos).  Un parsito (tricomoniasis).  Un virus (vaginitis viral).  Niveles bajos de hormonas (vaginitis atrfica). Los niveles bajos de hormonas pueden ocurrir durante el embarazo o la Market researcher, o despus de la menopausia.  Agentes irritantes, como baos de espuma, tampones perfumados y aerosoles vaginales (vaginitis alrgica). Hay otros factores que pueden alterar el equilibrio normal de las bacterias y los hongos que viven en la vagina. Estas incluyen los siguientes:  Tomar antibiticos.  Falta de higiene.  Diafragmas, esponjas vaginales, espermicidas, pastillas anticonceptivas y dispositivos intrauterinos (DIU).  Relaciones sexuales.  Infeccin.  Diabetes que no se controla.  Tener un sistema inmunitario debilitado. Cules son los signos o los sntomas? Los sntomas pueden variar segn la causa de la vaginitis. Entre los sntomas ms frecuentes, se incluyen los siguientes:  Secrecin vaginal anormal. ? La secrecin es blanca, gris o amarilla en el caso de vaginosis bacteriana. ? La secrecin es espesa, blanca y de consistencia similar al queso en caso de infeccin por hongos. ? La secrecin es espumosa y [place] o verdosa en caso de  tricomoniasis.  Olor vaginal desagradable. ? El olor es similar al pescado en caso de vaginosis bacteriana.  Picazn, dolor o hinchazn vaginal.  Relaciones sexuales dolorosas.  Dolor o ardor al Geographical information systems officer. En algunos casos, no hay sntomas. Cmo se trata? El tratamiento depender del tipo de infeccin.  La vaginosis bacteriana y la tricomoniasis suelen tratarse con cremas o pastillas con antibiticos.  Las infecciones por hongos suelen tratarse con antimicticos, como cremas u vulos.  La vaginitis viral no tiene [place], pero los sntomas pueden tratarse con medicamentos que [place] incomodidad. Su pareja sexual tambin debe tratarse.  La vaginitis atrfica puede tratarse con cremas, pldoras u vulos con estrgeno, o con un anillo vaginal. Si hay sequedad vaginal, los lubricantes y las cremas [address] pueden ser de [place]. Es posible que deba evitar el uso de jabones perfumados, aerosoles o duchas vaginales.  El tratamiento de la vaginitis alrgica implica interrumpir el uso del producto que est causando el problema. Se pueden utilizar cremas vaginales para tratar los sntomas. Siga estas indicaciones en su casa:  Tome todos los medicamentos segn le indic su mdico.  Mantenga la zona genital limpia y seca. Use solo agua para enjuagar la zona; no use jabn.  Evite la ducha vaginal. Esta puede eliminar las bacterias beneficiosas de la vagina.  No use tampones ni tenga relaciones sexuales hasta tratarse la vaginitis. Use compresas higinicas mientras tenga vaginitis.  Higiencese de adelante hacia atrs. Esto evita el esparcimiento de las bacterias del recto a la vagina.  Airee la zona genital. Use ropa interior de algodn para reducir la acumulacin de humedad. ? No utilice ropa interior para dormir hasta que la vaginitis desaparezca. ? No use pantalones ni ropa interior ajustados, o medias de nailon sin proteccin de algodn. ?  Qutese la ropa hmeda (en especial el traje  de bao) lo antes posible.  Utilice productos suaves y sin perfume. No utilice agentes irritantes, por ejemplo: ? Aerosoles femeninos perfumados. ? Suavizantes para la ropa. ? Detergentes perfumados. ? Tampones perfumados. ? Jabones perfumados o baos de espuma.  Practique el sexo seguro y use preservativos. Los preservativos previenen el contagio de la tricomoniasis y la vaginitis viral. Comunquese con un mdico si:  Siente dolor abdominal.  Tiene sntomas que duran ms de 2 o 3das.  Tiene fiebre y los sntomas empeoran repentinamente. Esta informacin no tiene Marine scientist el consejo del mdico. Asegrese de hacerle al mdico cualquier pregunta que tenga. Document Released: 06/30/2008 Document Revised: 12/08/2016 Document Reviewed: 07/27/2016 Elsevier Patient Education  2020 [employer].

## 2019-02-12 LAB — CERVICOVAGINAL ANCILLARY ONLY
Bacterial Vaginitis (gardnerella): POSITIVE — AB
Candida Glabrata: NEGATIVE
Candida Vaginitis: NEGATIVE
Chlamydia: NEGATIVE
Comment: NEGATIVE
Comment: NEGATIVE
Comment: NEGATIVE
Comment: NEGATIVE
Comment: NEGATIVE
Comment: NORMAL
Neisseria Gonorrhea: NEGATIVE
Trichomonas: NEGATIVE

## 2019-03-25 ENCOUNTER — Ambulatory Visit: Payer: BC Managed Care – PPO | Admitting: Obstetrics & Gynecology

## 2019-06-14 ENCOUNTER — Ambulatory Visit: Payer: Self-pay | Admitting: Physician Assistant

## 2019-06-17 ENCOUNTER — Ambulatory Visit: Payer: Self-pay | Admitting: Adult Health

## 2019-07-16 NOTE — Progress Notes (Deleted)
    New patient visit    Patient: Jill Montgomery   DOB: 31-Aug-1990   28 y.o. Female  MRN: [id number] Visit Date: 07/16/2019  Today's healthcare provider: Jairo Ben, FNP  Subjective:   No chief complaint on file.   Jill Montgomery is a 29 y.o. female who presents today as a new patient to establish care.  HPI  ***  Past Medical History:  Diagnosis Date  . Ovarian cyst    Past Surgical History:  Procedure Laterality Date  . CHROMOPERTUBATION N/A 09/01/2017   Procedure: CHROMOPERTUBATION;  Surgeon: Christeen Douglas, MD;  Location: [facility];  Service: Gynecology;  Laterality: N/A;  . LAPAROSCOPIC OVARIAN CYSTECTOMY Left 09/01/2017   Procedure: LAPAROSCOPIC OVARIAN CYSTECTOMY;  Surgeon: Christeen Douglas, MD;  Location: [facility];  Service: Gynecology;  Laterality: Left;  . NO PAST SURGERIES    . TUBAL LIGATION Bilateral 10/04/2018   Procedure: POST PARTUM TUBAL LIGATION;  Surgeon: Nadara Mustard, MD;  Location: [facility];  Service: Gynecology;  Laterality: Bilateral;   Family Status  Relation Name Status  . Mother  Alive  . Father  Alive   No family history on file. Social History   Socioeconomic History  . Marital status: Married    Spouse name: Not on file  . Number of children: Not on file  . Years of education: Not on file  . Highest education level: Not on file  Occupational History  . Not on file  Tobacco Use  . Smoking status: Never Smoker  . Smokeless tobacco: Never Used  Substance and Sexual Activity  . Alcohol use: Not Currently  . Drug use: Never  . Sexual activity: Yes    Birth control/protection: None  Other Topics Concern  . Not on file  Social History Narrative  . Not on file   Social Determinants of Health   Financial Resource Strain:   . Difficulty of Paying Living Expenses:   Food Insecurity:   . Worried About Programme researcher, broadcasting/film/video in the Last Year:   . Barista in the Last Year:   Transportation Needs:   . Freight forwarder  (Medical):   Marland Kitchen Lack of Transportation (Non-Medical):   Physical Activity:   . Days of Exercise per Week:   . Minutes of Exercise per Session:   Stress:   . Feeling of Stress :   Social Connections:   . Frequency of Communication with Friends and Family:   . Frequency of Social Gatherings with Friends and Family:   . Attends Religious Services:   . Active Member of Clubs or Organizations:   . Attends Banker Meetings:   Marland Kitchen Marital Status:    Outpatient Medications Prior to Visit  Medication Sig  . Prenatal Vit-Fe Fumarate-FA (PRENATAL VITAMIN PO) Take by mouth.  . pyridOXINE (VITAMIN B-6) 25 MG tablet Take by mouth.   No facility-administered medications prior to visit.   No Known Allergies  Patient Care Team: Patient, No Pcp Per as PCP - General (General Practice)  Review of Systems  {Show previous labs (optional):23779::" "}   Objective:    There were no vitals taken for this visit. Physical Exam ***  No results found for any visits on 07/23/19.    Assessment & Plan:    ***  No follow-ups on file.     {provider attestation***:1}   Jairo Ben, FNP  [facility] [phone] (phone) [phone] (fax)  [facility]

## 2019-07-23 ENCOUNTER — Ambulatory Visit: Payer: BC Managed Care – PPO | Admitting: Adult Health

## 2020-12-16 ENCOUNTER — Other Ambulatory Visit (HOSPITAL_COMMUNITY)
Admission: RE | Admit: 2020-12-16 | Discharge: 2020-12-16 | Disposition: A | Discharge status: Home / Self Care | Payer: BC Managed Care – PPO | Source: Ambulatory Visit | Attending: Obstetrics | Admitting: Obstetrics

## 2020-12-16 ENCOUNTER — Encounter: Payer: Self-pay | Admitting: Obstetrics

## 2020-12-16 ENCOUNTER — Other Ambulatory Visit: Payer: Self-pay

## 2020-12-16 ENCOUNTER — Ambulatory Visit (INDEPENDENT_AMBULATORY_CARE_PROVIDER_SITE_OTHER): Payer: BC Managed Care – PPO | Admitting: Obstetrics

## 2020-12-16 VITALS — BP 112/68 | HR 80 | Ht 63.0 in | Wt 136.0 lb

## 2020-12-16 DIAGNOSIS — Z124 Encounter for screening for malignant neoplasm of cervix: Secondary | ICD-10-CM | POA: Diagnosis present

## 2020-12-16 DIAGNOSIS — Z118 Encounter for screening for other infectious and parasitic diseases: Secondary | ICD-10-CM | POA: Diagnosis not present

## 2020-12-16 DIAGNOSIS — R109 Unspecified abdominal pain: Secondary | ICD-10-CM

## 2020-12-16 DIAGNOSIS — Z01419 Encounter for gynecological examination (general) (routine) without abnormal findings: Secondary | ICD-10-CM | POA: Diagnosis present

## 2020-12-16 DIAGNOSIS — Z113 Encounter for screening for infections with a predominantly sexual mode of transmission: Secondary | ICD-10-CM

## 2020-12-16 DIAGNOSIS — Z1151 Encounter for screening for human papillomavirus (HPV): Secondary | ICD-10-CM | POA: Insufficient documentation

## 2020-12-16 LAB — POCT URINALYSIS DIPSTICK
Bilirubin, UA: NEGATIVE
Glucose, UA: NEGATIVE
Ketones, UA: NEGATIVE
Leukocytes, UA: NEGATIVE
Nitrite, UA: NEGATIVE
Protein, UA: NEGATIVE
Spec Grav, UA: 1.015 (ref 1.010–1.025)
Urobilinogen, UA: 0.2 E.U./dL
pH, UA: 6.5 (ref 5.0–8.0)

## 2020-12-16 NOTE — Progress Notes (Signed)
Gynecology Annual Exam   PCP: Patient, No Pcp Per (Inactive)  Chief Complaint:  Chief Complaint  Patient presents with   Gynecologic Exam    Annual - Lt sided pain comes and goes. RM 5    History of Present Illness: Patient is a 30 y.o. [id number] presents for annual exam. The patient has no complaints today.   LMP: [name] last menstrual period was 12/10/2020. Average Interval: regular, 28 days Duration of flow: 6 days Heavy Menses: no Clots: no Intermenstrual Bleeding: no Postcoital Bleeding: no Dysmenorrhea: no  The patient is sexually active. She currently uses tubal ligation for contraception. She denies dyspareunia.  The patient does not perform self breast exams.  There is no notable family history of breast or ovarian cancer in her family.  The patient wears seatbelts: yes.   The patient has regular exercise: yes.    The patient denies current symptoms of depression.    Review of Systems: Review of Systems  Constitutional: Negative.   HENT: Negative.    Eyes: Negative.   Respiratory: Negative.    Cardiovascular: Negative.   Gastrointestinal: Negative.   Genitourinary: Negative.   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   Skin: Negative.   Neurological: Negative.   Endo/Heme/Allergies: Negative.   Psychiatric/Behavioral: Negative.     Past Medical History:  Patient Active Problem List   Diagnosis Date Noted   Normal vaginal delivery 10/05/2018   Adnexal tenderness 03/05/2018   Adnexal cyst 07/27/2017    4.6 cm with septation of 1.8 cm on Lt ovary, Rt ovary simple cyst: 1.1cm     Past Surgical History:  Past Surgical History:  Procedure Laterality Date   CHROMOPERTUBATION N/A 09/01/2017   Procedure: CHROMOPERTUBATION;  Surgeon: Christeen Douglas, MD;  Location: [facility];  Service: Gynecology;  Laterality: N/A;   LAPAROSCOPIC  OVARIAN CYSTECTOMY Left 09/01/2017   Procedure: LAPAROSCOPIC OVARIAN CYSTECTOMY;  Surgeon: Christeen Douglas, MD;  Location: [facility];  Service: Gynecology;  Laterality: Left;   NO PAST SURGERIES     TUBAL LIGATION Bilateral 10/04/2018   Procedure: POST PARTUM TUBAL LIGATION;  Surgeon: Nadara Mustard, MD;  Location: [facility];  Service: Gynecology;  Laterality: Bilateral;    Gynecologic History:  [name] last menstrual period was 12/10/2020. Contraception: tubal ligation Last Pap: Results were:  NILM  with HPV noted in 2020    Obstetric History: [id number]  Family History:  History reviewed. No pertinent family history.  Social History:  Social History   Socioeconomic History   Marital status: Married    Spouse name: Not on file   Number of children: Not on file   Years of education: Not on file   Highest education level: Not on file  Occupational History   Not on file  Tobacco Use   Smoking status: Never   Smokeless tobacco: Never  Vaping Use   Vaping Use: Never used  Substance and Sexual Activity   Alcohol use: Not Currently   Drug use: Never   Sexual activity: Yes    Birth control/protection: Surgical    Comment: BTL  Other Topics Concern   Not on file  Social History Narrative   Not on file   Social Determinants of Health   Financial Resource Strain: Not on file  Food Insecurity: Not on file  Transportation Needs: Not on  file  Physical Activity: Not on file  Stress: Not on file  Social Connections: Not on file  Intimate Partner Violence: Not on file    Allergies:  No Known Allergies  Medications: Prior to Admission medications   Medication Sig Start Date End Date Taking? Authorizing Provider  doxycycline (VIBRAMYCIN) 100 MG capsule Take 100 mg by mouth 2 (two) times daily. 12/07/20   [provider]  fluconazole (DIFLUCAN) 150 MG tablet Take by mouth. 12/07/20   [provider]  metroNIDAZOLE (FLAGYL) 500 MG tablet SMARTSIG:1 Tablet(s) By  Mouth Every 12 Hours 12/07/20   [provider]    Physical Exam Vitals: Blood pressure 112/68, pulse 80, height 5\' 3"  (1.6 m), weight 136 lb (61.7 kg), last menstrual period 12/10/2020, unknown if currently breastfeeding.  General: NAD HEENT: normocephalic, anicteric Thyroid: no enlargement, no palpable nodules Pulmonary: No increased work of breathing, CTAB Cardiovascular: RRR, distal pulses 2+ Breast: Breast symmetrical, no tenderness, no palpable nodules or masses, no skin or nipple retraction present, no nipple discharge.  No axillary or supraclavicular lymphadenopathy. Abdomen: NABS, soft, non-tender, non-distended.  Umbilicus without lesions.  No hepatomegaly, splenomegaly or masses palpable. No evidence of hernia  Genitourinary:  External: Normal external female genitalia.  Normal urethral meatus, normal Bartholin's and Skene's glands.    Vagina: Normal vaginal mucosa, no evidence of prolapse.    Cervix: Grossly normal in appearance, no bleeding  Uterus: Non-enlarged, mobile, normal contour.  No CMT  Adnexa: ovaries non-enlarged, no adnexal masses  Rectal: deferred  Lymphatic: no evidence of inguinal lymphadenopathy Extremities: no edema, erythema, or tenderness Neurologic: Grossly intact Psychiatric: mood appropriate, affect full  Female chaperone present for pelvic and breast  portions of the physical exam    Assessment: 30 y.o. 26 routine annual exam  Plan: Problem List Items Addressed This Visit   None Visit Diagnoses     Women's annual routine gynecological examination    -  Primary   Cervical cancer screening           2) STI screening  wasoffered and accepted  2)  ASCCP guidelines and rational discussed.  Patient opts for yearly screening interval  3) Contraception - the patient is currently using  tubal ligation.  She is happy with her current form of contraception and plans to continue  4) Routine healthcare maintenance including  cholesterol, diabetes screening discussed  to be set up with her PCP. She is seeking aPCP  5) Return in about 1 year (around 12/16/2021) for annual.   12/18/2021, CNM  12/16/2020 10:55 AM   [facility] 12/16/2020, 10:55 AM

## 2020-12-18 LAB — CYTOLOGY - PAP
Chlamydia: NEGATIVE
Comment: NEGATIVE
Comment: NEGATIVE
Comment: NEGATIVE
Comment: NORMAL
Diagnosis: NEGATIVE
High risk HPV: NEGATIVE
Neisseria Gonorrhea: NEGATIVE
Trichomonas: NEGATIVE
